# Patient Record
Sex: Female | Born: 1995 | Race: Black or African American | Hispanic: No | Marital: Married | Smoking: Never smoker
Health system: Southern US, Community
[De-identification: ages and names within clinical notes are randomized; demographics above are authoritative.]

## PROBLEM LIST (undated history)

## (undated) ENCOUNTER — Inpatient Hospital Stay (HOSPITAL_COMMUNITY): Payer: Self-pay

## (undated) DIAGNOSIS — E559 Vitamin D deficiency, unspecified: Secondary | ICD-10-CM

## (undated) DIAGNOSIS — R002 Palpitations: Secondary | ICD-10-CM

## (undated) DIAGNOSIS — G43909 Migraine, unspecified, not intractable, without status migrainosus: Secondary | ICD-10-CM

## (undated) DIAGNOSIS — K5900 Constipation, unspecified: Secondary | ICD-10-CM

## (undated) DIAGNOSIS — F32A Depression, unspecified: Secondary | ICD-10-CM

## (undated) DIAGNOSIS — F419 Anxiety disorder, unspecified: Secondary | ICD-10-CM

## (undated) DIAGNOSIS — M549 Dorsalgia, unspecified: Secondary | ICD-10-CM

## (undated) HISTORY — DX: Dorsalgia, unspecified: M54.9

## (undated) HISTORY — DX: Depression, unspecified: F32.A

## (undated) HISTORY — DX: Migraine, unspecified, not intractable, without status migrainosus: G43.909

## (undated) HISTORY — DX: Constipation, unspecified: K59.00

## (undated) HISTORY — DX: Vitamin D deficiency, unspecified: E55.9

## (undated) HISTORY — DX: Anxiety disorder, unspecified: F41.9

## (undated) HISTORY — DX: Palpitations: R00.2

---
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
## 2014-01-23 ENCOUNTER — Emergency Department (HOSPITAL_COMMUNITY): Payer: BC Managed Care – PPO

## 2014-01-23 ENCOUNTER — Emergency Department (HOSPITAL_COMMUNITY)
Admission: EM | Admit: 2014-01-23 | Discharge: 2014-01-23 | Disposition: A | Discharge status: Home / Self Care | Payer: BC Managed Care – PPO | Attending: Emergency Medicine | Admitting: Emergency Medicine

## 2014-01-23 ENCOUNTER — Encounter (HOSPITAL_COMMUNITY): Payer: Self-pay | Admitting: Emergency Medicine

## 2014-01-23 DIAGNOSIS — Z880 Allergy status to penicillin: Secondary | ICD-10-CM | POA: Diagnosis not present

## 2014-01-23 DIAGNOSIS — R079 Chest pain, unspecified: Secondary | ICD-10-CM | POA: Diagnosis not present

## 2014-01-23 DIAGNOSIS — R0602 Shortness of breath: Secondary | ICD-10-CM | POA: Diagnosis not present

## 2014-01-23 DIAGNOSIS — M79604 Pain in right leg: Secondary | ICD-10-CM | POA: Insufficient documentation

## 2014-01-23 DIAGNOSIS — M79609 Pain in unspecified limb: Secondary | ICD-10-CM

## 2014-01-23 DIAGNOSIS — Z3202 Encounter for pregnancy test, result negative: Secondary | ICD-10-CM | POA: Diagnosis not present

## 2014-01-23 DIAGNOSIS — M25512 Pain in left shoulder: Secondary | ICD-10-CM | POA: Diagnosis not present

## 2014-01-23 DIAGNOSIS — R05 Cough: Secondary | ICD-10-CM | POA: Insufficient documentation

## 2014-01-23 LAB — CBC WITH DIFFERENTIAL/PLATELET
Basophils Absolute: 0 K/uL (ref 0.0–0.1)
Basophils Relative: 0 % (ref 0–1)
Eosinophils Absolute: 0 K/uL (ref 0.0–0.7)
Eosinophils Relative: 1 % (ref 0–5)
HCT: 37.4 % (ref 36.0–46.0)
Hemoglobin: 12.7 g/dL (ref 12.0–15.0)
Lymphocytes Relative: 39 % (ref 12–46)
Lymphs Abs: 2.2 K/uL (ref 0.7–4.0)
MCH: 28.9 pg (ref 26.0–34.0)
MCHC: 34 g/dL (ref 30.0–36.0)
MCV: 85 fL (ref 78.0–100.0)
Monocytes Absolute: 0.4 K/uL (ref 0.1–1.0)
Monocytes Relative: 6 % (ref 3–12)
Neutro Abs: 3.1 K/uL (ref 1.7–7.7)
Neutrophils Relative %: 54 % (ref 43–77)
Platelets: 204 K/uL (ref 150–400)
RBC: 4.4 MIL/uL (ref 3.87–5.11)
RDW: 11.4 % — ABNORMAL LOW (ref 11.5–15.5)
WBC: 5.8 K/uL (ref 4.0–10.5)

## 2014-01-23 LAB — BASIC METABOLIC PANEL
Anion gap: 11 (ref 5–15)
BUN: 11 mg/dL (ref 6–23)
CALCIUM: 9.5 mg/dL (ref 8.4–10.5)
CO2: 24 mEq/L (ref 19–32)
Chloride: 100 mEq/L (ref 96–112)
Creatinine, Ser: 0.65 mg/dL (ref 0.50–1.10)
GFR calc Af Amer: >90 mL/min (ref >90)
GLUCOSE: 88 mg/dL (ref 70–99)
Potassium: 3.9 mEq/L (ref 3.7–5.3)
Sodium: 135 mEq/L — ABNORMAL LOW (ref 137–147)

## 2014-01-23 LAB — POC URINE PREG, ED: Preg Test, Ur: NEGATIVE

## 2014-01-23 LAB — D-DIMER, QUANTITATIVE: D-Dimer, Quant: <0.27 ug{FEU}/mL (ref 0.00–0.48)

## 2014-01-23 LAB — I-STAT TROPONIN, ED: Troponin i, poc: 0 ng/mL (ref 0.00–0.08)

## 2014-01-23 MED ORDER — HYDROCODONE-ACETAMINOPHEN 5-325 MG PO TABS
2.0000 | ORAL_TABLET | Freq: Once | ORAL | Status: AC
  Administered 2014-01-23: 2 via ORAL
  Filled 2014-01-23: qty 2

## 2014-01-23 MED ORDER — NAPROXEN 500 MG PO TABS: 500.0000 mg | ORAL_TABLET | Freq: Two times a day (BID) | ORAL | Status: DC

## 2014-01-23 NOTE — Discharge Instructions (Signed)

## 2014-01-23 NOTE — ED Notes (Addendum)
Pt sts she stopped taking her birth control approx 1 week ago.  Sts pain in right knee started last night.  Pt sts she was walking across campus and began to feel pain and has not resolved this morning.  Pt sts pain is worse with movement.   Pt sts chest pain started when she was "looking around on the Internet for a diagnosis".   Pt sts she has a hx of anxiety, but does not currently take anything for it.

## 2014-01-23 NOTE — ED Notes (Signed)
Pt. Stated, I started having leg pain on my right leg yesterday and chest pain started today. I also have a cold with a cough.

## 2014-01-23 NOTE — ED Notes (Signed)
Spoke to xray, are coming to get pt now.

## 2014-01-23 NOTE — ED Provider Notes (Signed)
CSN: [id number]     Arrival date & time 01/23/14  1324 History   First MD Initiated Contact with Patient 01/23/14 1441     Chief Complaint  Patient presents with  . Leg Pain     (Consider location/radiation/quality/duration/timing/severity/associated sxs/prior Treatment) Patient is a 18 y.o. female presenting with leg pain and chest pain. The history is provided by the patient. No language interpreter was used.  Leg Pain Location:  Leg Time since incident:  1 day Injury: no   Leg location:  R lower leg Pain details:    Quality:  Aching   Radiates to:  Does not radiate   Severity:  Moderate   Duration:  1 day   Timing:  Constant   Progression:  Unchanged Chronicity:  New Dislocation: no   Foreign body present:  No foreign bodies Tetanus status:  Up to date Prior injury to area:  No Relieved by:  Nothing Worsened by:  Activity Ineffective treatments:  None tried Associated symptoms: swelling   Associated symptoms: no back pain, no decreased ROM, no fatigue, no fever, no itching, no muscle weakness, no neck pain, no numbness, no stiffness and no tingling   Chest Pain Pain location:  Substernal area Pain quality: pressure   Pain radiates to:  Does not radiate Pain radiates to the back: no   Pain severity:  Moderate Duration:  7 hours Timing:  Intermittent Progression:  Waxing and waning Chronicity:  New Context: at rest   Relieved by:  Nothing Worsened by:  Nothing tried Ineffective treatments:  Rest Associated symptoms: cough and shortness of breath   Associated symptoms: no abdominal pain, no anorexia, no back pain, no diaphoresis, no fatigue, no fever, no headache, no nausea, no numbness, no palpitations, no syncope, not vomiting and no weakness   Associated symptoms comment:  Chills, body aches Risk factors: birth control   Risk factors: no aortic disease, no coronary artery disease, no diabetes mellitus, no high cholesterol, no hypertension, no immobilization, not  female, no Marfan's syndrome, not obese, no prior DVT/PE, no smoking and no surgery     History reviewed. No pertinent past medical history. History reviewed. No pertinent past surgical history. No family history on file. History  Substance Use Topics  . Smoking status: Never Smoker   . Smokeless tobacco: Not on file  . Alcohol Use: No   OB History   Grav Para Term Preterm Abortions TAB SAB Ect Mult Living                 Review of Systems  Constitutional: Negative for fever, chills, diaphoresis, activity change, appetite change and fatigue.  HENT: Negative for congestion, facial swelling, rhinorrhea and sore throat.   Eyes: Negative for photophobia and discharge.  Respiratory: Positive for cough and shortness of breath. Negative for chest tightness.   Cardiovascular: Positive for chest pain. Negative for palpitations, leg swelling and syncope.  Gastrointestinal: Negative for nausea, vomiting, abdominal pain, diarrhea and anorexia.  Endocrine: Negative for polydipsia and polyuria.  Genitourinary: Negative for dysuria, frequency, difficulty urinating and pelvic pain.  Musculoskeletal: Negative for arthralgias, back pain, neck pain, neck stiffness and stiffness.  Skin: Negative for color change, itching and wound.  Allergic/Immunologic: Negative for immunocompromised state.  Neurological: Negative for facial asymmetry, weakness, numbness and headaches.  Hematological: Does not bruise/bleed easily.  Psychiatric/Behavioral: Negative for confusion and agitation.      Allergies  Penicillins  Home Medications   Prior to Admission medications   Medication Sig Start  Date End Date Taking? Authorizing Provider  ibuprofen (ADVIL,MOTRIN) 200 MG tablet Take 200 mg by mouth every 6 (six) hours as needed.   Yes Historical Provider, MD  naproxen (NAPROSYN) 500 MG tablet Take 1 tablet (500 mg total) by mouth 2 (two) times daily. 01/23/14   Toy Cookey, MD   BP 128/77  Pulse 71   Temp(Src) 98.1 F (36.7 C) (Oral)  Resp 18  SpO2 99%  LMP 01/14/2014 Physical Exam  Constitutional: She is oriented to person, place, and time. She appears well-developed and well-nourished. No distress.  HENT:  Head: Normocephalic and atraumatic.  Mouth/Throat: No oropharyngeal exudate.  Eyes: Pupils are equal, round, and reactive to light.  Neck: Normal range of motion. Neck supple.    Cardiovascular: Normal rate, regular rhythm and normal heart sounds.  Exam reveals no gallop and no friction rub.   No murmur heard. Pulmonary/Chest: Effort normal and breath sounds normal. No respiratory distress. She has no wheezes. She has no rales.    Abdominal: Soft. Bowel sounds are normal. She exhibits no distension and no mass. There is no tenderness. There is no rebound and no guarding.  Musculoskeletal: Normal range of motion. She exhibits no edema.       Left shoulder: She exhibits tenderness.       Back:       Arms:      Legs: Neurological: She is alert and oriented to person, place, and time.  Skin: Skin is warm and dry.  Psychiatric: She has a normal mood and affect.    ED Course  Procedures (including critical care time) Labs Review Labs Reviewed  CBC WITH DIFFERENTIAL - Abnormal; Notable for the following:    RDW 11.4 (*)    All other components within normal limits  BASIC METABOLIC PANEL - Abnormal; Notable for the following:    Sodium 135 (*)    All other components within normal limits  D-DIMER, QUANTITATIVE  POC URINE PREG, ED  Rosezena Sensor, ED    Imaging Review Dg Chest 2 View  01/23/2014   CLINICAL DATA:  Chest pain.  Shortness of breath.  EXAM: CHEST  2 VIEW  COMPARISON:  None.  FINDINGS: The heart size and mediastinal contours are within normal limits. Both lungs are clear. The visualized skeletal structures are unremarkable.  IMPRESSION: No active cardiopulmonary disease.   Electronically Signed   By: Myles Rosenthal M.D.   On: 01/23/2014 19:39     EKG  Interpretation   Date/Time:  Saturday January 23 2014 16:34:14 EDT Ventricular Rate:  70 PR Interval:  190 QRS Duration: 84 QT Interval:  389 QTC Calculation: 420 R Axis:   101 Text Interpretation:  Sinus rhythm Borderline right axis deviation No  prior for comparison Confirmed by DOCHERTY  MD, MEGAN ([id number]) on 01/23/2014  4:39:00 PM      MDM   Final diagnoses:  Chest pain  Right leg pain    Pt is a 18 y.o. female with Pmhx as above who presents with about 24 hours of right posterior leg pain without known injury as well as 7 hours of intermittent midsternal chest pressure without radiation. She has had mild associated shortness of breath, no fevers. She also reports about 1 week of URI symptoms with productive cough, chills and body aches. She has recently been on OCPs, though has not taken them for one week. She is a nonsmoker. No significant recent travel history or immobilization. On physical exam vital signs are stable and she  is in no acute distress. Cardiopulmonary exam is benign. She has tenderness to palpation of right calf and right posterior knee.  DVT study nml, CXR nml, trop, D-dimer nml. Doubt PE or cardiogenic CP, Will d/c home w/ BID naproxen. Return precautions given for new or worsening symptoms including worsening pain, fever, inability to tolerate PO.         Toy CookeyMegan Docherty, MD 01/23/14 2001

## 2015-11-24 IMAGING — CR DG CHEST 2V
2 series · 2 of 2 positions shown · non-contrast
Comparison: None.

CLINICAL DATA: Chest pain.  Shortness of breath.

EXAM:
CHEST  2 VIEW

[w chest pa]
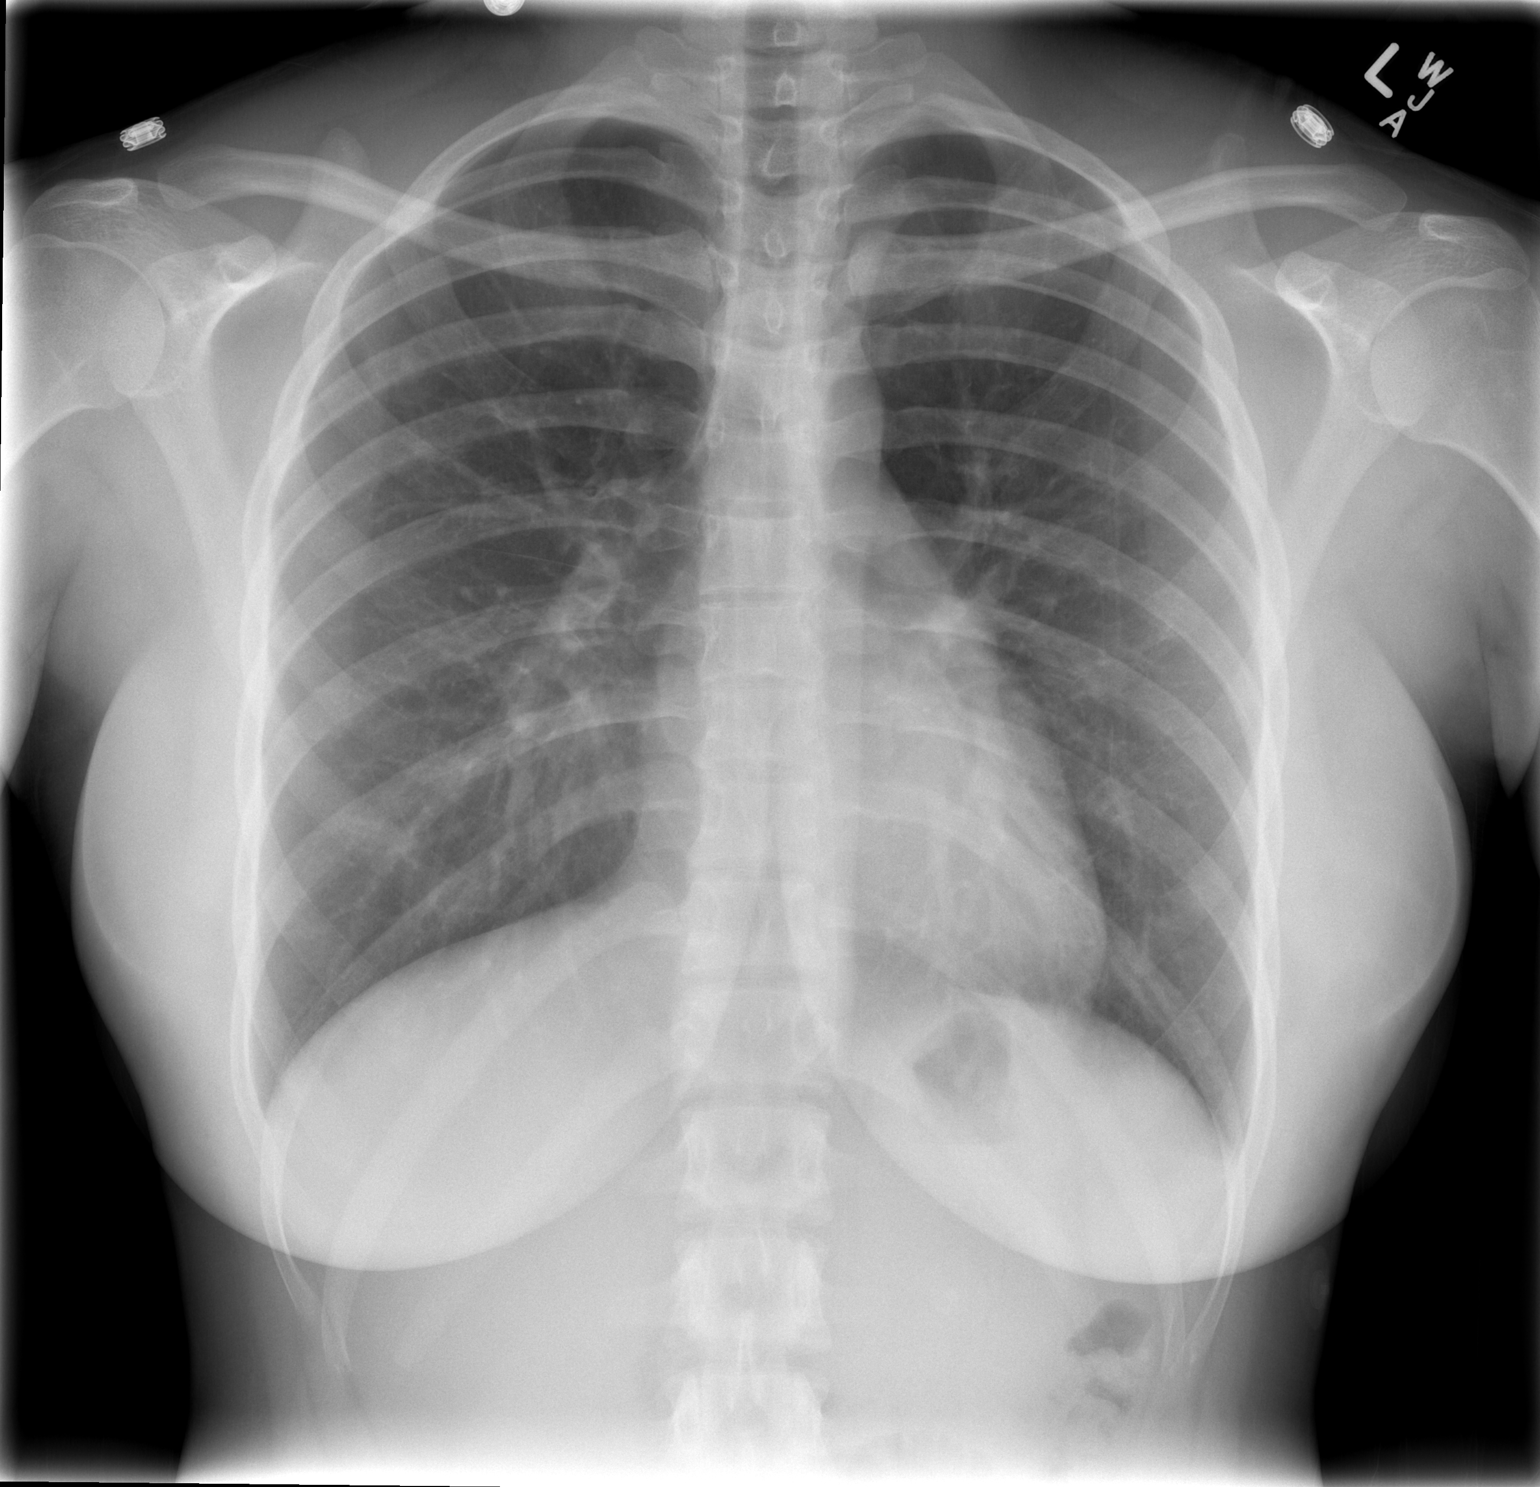

[w chest lat]
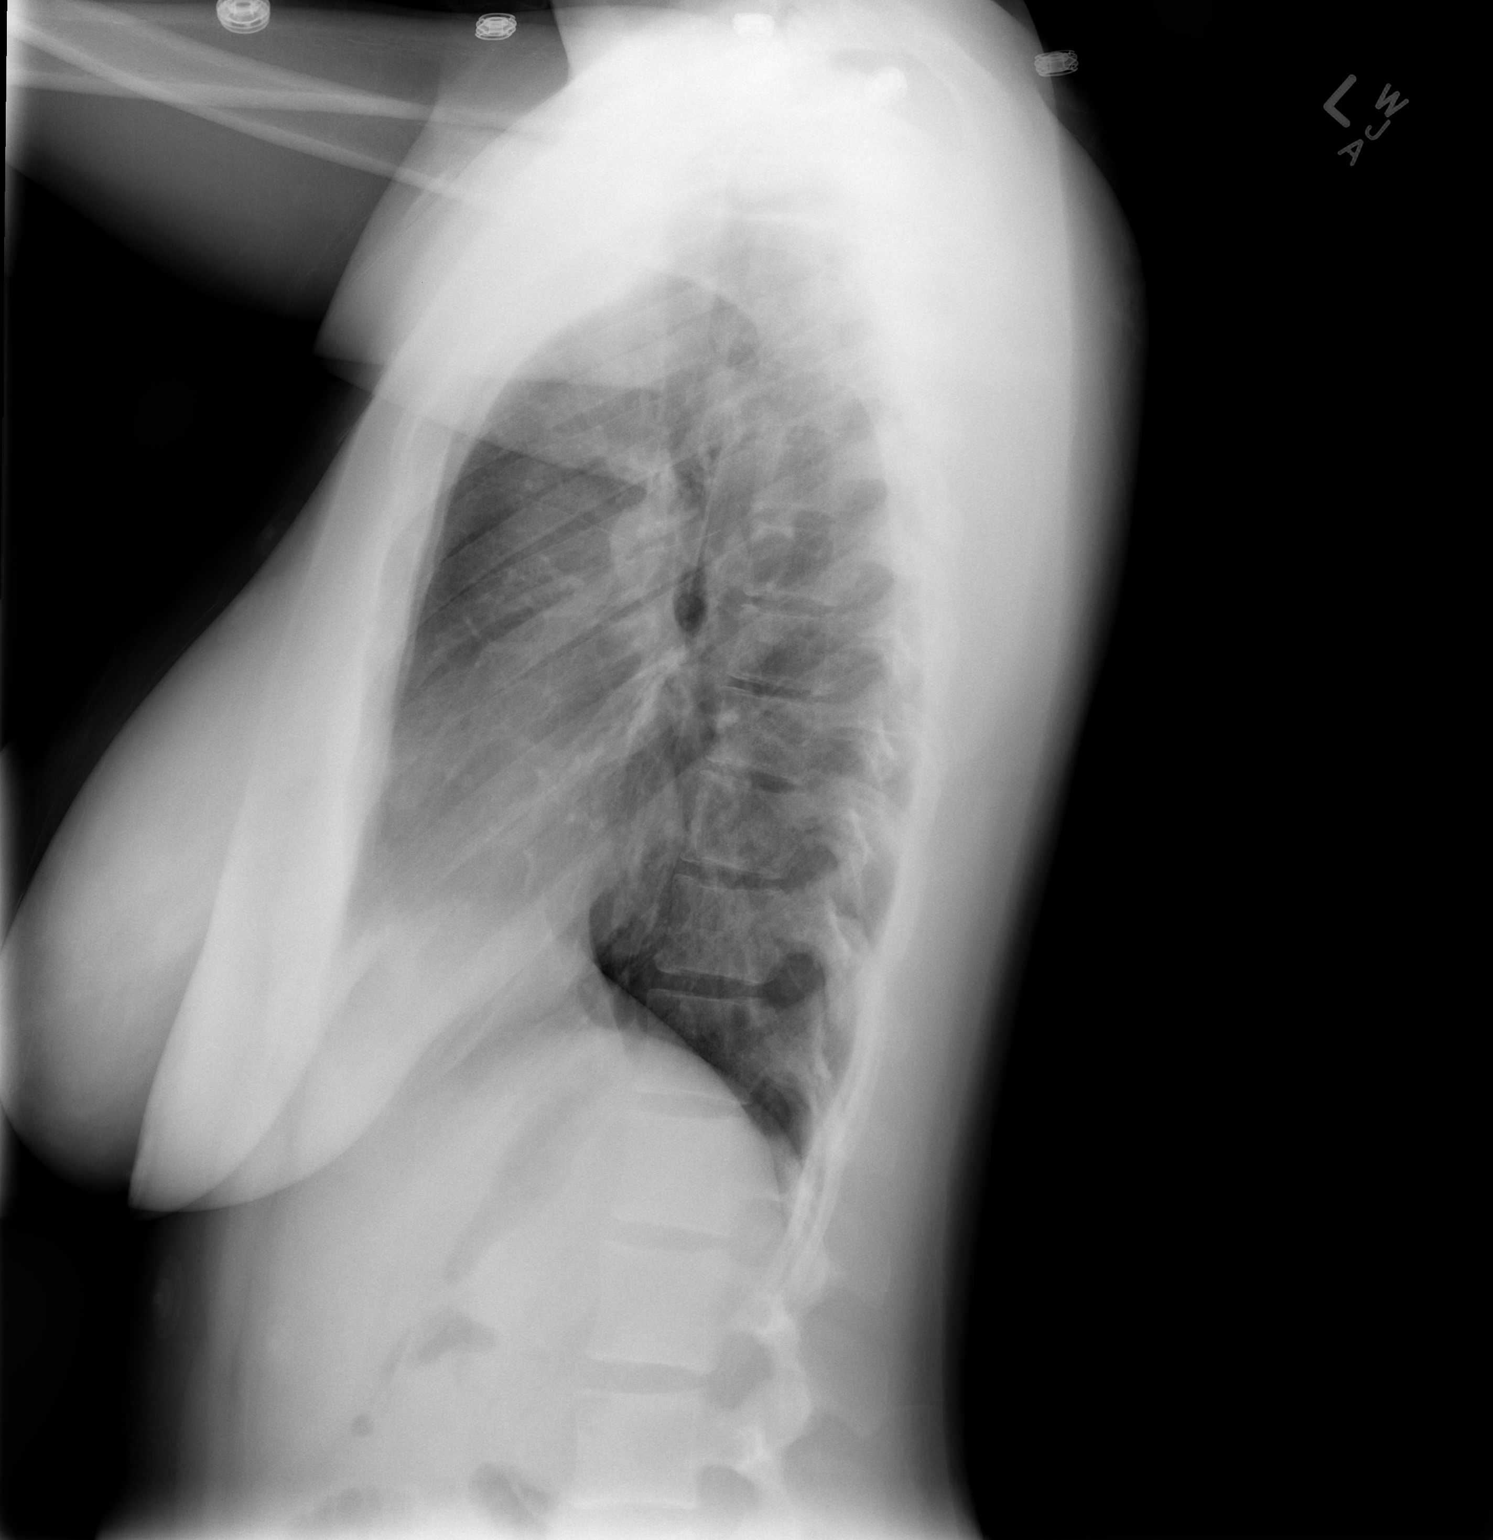

[2 of 2 positions shown; findings below may reference images not displayed]

FINDINGS: The heart size and mediastinal contours are within normal limits.
Both lungs are clear. The visualized skeletal structures are
unremarkable.
IMPRESSION: No active cardiopulmonary disease.

## 2017-02-16 ENCOUNTER — Encounter (HOSPITAL_COMMUNITY): Payer: Self-pay | Admitting: *Deleted

## 2017-02-16 ENCOUNTER — Other Ambulatory Visit: Payer: Self-pay

## 2017-02-16 ENCOUNTER — Ambulatory Visit (HOSPITAL_COMMUNITY)
Admission: EM | Admit: 2017-02-16 | Discharge: 2017-02-16 | Disposition: A | Discharge status: Home / Self Care | Payer: BLUE CROSS/BLUE SHIELD | Attending: Internal Medicine | Admitting: Internal Medicine

## 2017-02-16 DIAGNOSIS — M5489 Other dorsalgia: Secondary | ICD-10-CM | POA: Diagnosis not present

## 2017-02-16 DIAGNOSIS — R0789 Other chest pain: Secondary | ICD-10-CM

## 2017-02-16 DIAGNOSIS — T148XXA Other injury of unspecified body region, initial encounter: Secondary | ICD-10-CM

## 2017-02-16 MED ORDER — NAPROXEN 500 MG PO TABS: 500.0000 mg | ORAL_TABLET | Freq: Two times a day (BID) | ORAL | 0 refills | Status: AC

## 2017-02-16 NOTE — ED Provider Notes (Signed)
[facility]    CSN: [id number] Arrival date & time: 02/16/17  1424     History   Chief Complaint Chief Complaint  Patient presents with  . Back Pain  . Chest Pain    HPI Donna Galloway is a 21 y.o. female.   21 year old female comes in for left scapula pain and chest pain last night. States chest pain has resolved but having left scapula pain during movements. States no pain at rest. 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 She states turning her neck can increase symptoms as well. Has not taken anything for the symptoms. Denies shortness of breath, wheezing. States some tingling of the hands occasionally. States family history significant for heart disease at late 3730's.       History reviewed. No pertinent past medical history.  There are no active problems to display for this patient.   History reviewed. No pertinent surgical history.  OB History    No data available       Home Medications    Prior to Admission medications   Medication Sig Start Date End Date Taking? Authorizing Provider  naproxen (NAPROSYN) 500 MG tablet Take 1 tablet (500 mg total) by mouth 2 (two) times daily for 10 days. 02/16/17 02/26/17  Belinda FisherYu, Amy V, PA-C    Family History No family history on file.  Social History Social History   Tobacco Use  . Smoking status: Current Some Day Smoker  . Tobacco comment: black & milds  Substance Use Topics  . Alcohol use: Yes    Comment: occasionally  . Drug use: No     Allergies   Acetic acid and Penicillins   Review of Systems Review of Systems  Reason unable to perform ROS: See HPI as above.     Physical Exam Triage Vital Signs ED Triage Vitals [02/16/17 1524]  Enc Vitals Group     BP 112/65     Pulse Rate 82     Resp 18     Temp 98.3 F (36.8 C)     Temp Source Oral     SpO2 100 %     Weight      Height      Head Circumference      Peak Flow      Pain Score      Pain Loc    Pain Edu?      Excl. in GC?    No data found.  Updated Vital Signs BP 112/65   Pulse 82   Temp 98.3 F (36.8 C) (Oral)   Resp 18   LMP 01/26/2017 (Approximate)   SpO2 100%   Physical Exam  Constitutional: She is oriented to person, place, and time. She appears well-developed and well-nourished. No distress.  HENT:  Head: Normocephalic and atraumatic.  Eyes: Conjunctivae are normal. Pupils are equal, round, and reactive to light.  Neck: Neck supple. Muscular tenderness (left) present. No spinous process tenderness present. Normal range of motion present.  Cardiovascular: Normal rate, regular rhythm and normal heart sounds. Exam reveals no gallop and no friction rub.  No murmur heard. Pulmonary/Chest: Effort normal and breath sounds normal. She has no wheezes. She has no rales. She exhibits no tenderness.  Musculoskeletal:  No tenderness on palpation of the spinous processes. Diffuse tenderness to palpation of the left upper back/trapezius muscle. Full range of motion of shoulders. Strength normal and equal bilaterally. Sensation intact and equal bilaterally.  Radial pulses  2+ and equal bilaterally. Capillary refill less than 2 seconds.   Neurological: She is alert and oriented to person, place, and time.  Skin: Skin is warm and dry.     UC Treatments / Results  Labs (all labs ordered are listed, but only abnormal results are displayed) Labs Reviewed - No data to display  EKG  EKG Interpretation None       Radiology No results found.  Procedures Procedures (including critical care time)  Medications Ordered in UC Medications - No data to display   Initial Impression / Assessment and Plan / UC Course  I have reviewed the triage vital signs and the nursing notes.  Pertinent labs & imaging results that were available during my care of the patient were reviewed by me and considered in my medical decision making (see chart for details).    Discussed with patient  history and exam most consistent with muscle strain. Start NSAID as directed for pain and inflammation. Ice/heat compresses. Discussed with patient strain can take up to 3-4 weeks to resolve, but should be getting better each week. Patient can follow up with PCP for stress test as needed if worried about cardiac problems. As patient without chest pain currently, deferred testing. Return precautions given.    Final Clinical Impressions(s) / UC Diagnoses   Final diagnoses:  Muscle strain    ED Discharge Orders        Ordered    naproxen (NAPROSYN) 500 MG tablet  2 times daily     02/16/17 1626        Belinda FisherYu, Amy V, [place]-C 02/16/17 1640

## 2017-02-16 NOTE — Discharge Instructions (Signed)
Your exam was most consistent with muscle strain. Start naproxen as directed. Ice/heat compresses as needed. This can take up to 3-4 weeks to completely resolve, but you should be feeling better each week. Stretches before work may help warm up your muscles to prevent further injury. Follow up here or with PCP if symptoms worsen, changes for reevaluation.

## 2017-02-16 NOTE — ED Triage Notes (Signed)
Started with pain beneath left scapula and left chest last night; pain has been constant with any movement or palpation.  At rest no pain.

## 2017-03-07 ENCOUNTER — Encounter (HOSPITAL_COMMUNITY): Payer: Self-pay | Admitting: Family Medicine

## 2017-03-07 ENCOUNTER — Ambulatory Visit (HOSPITAL_COMMUNITY)
Admission: EM | Admit: 2017-03-07 | Discharge: 2017-03-07 | Disposition: A | Discharge status: Home / Self Care | Payer: BLUE CROSS/BLUE SHIELD | Attending: Internal Medicine | Admitting: Internal Medicine

## 2017-03-07 DIAGNOSIS — R110 Nausea: Secondary | ICD-10-CM | POA: Diagnosis not present

## 2017-03-07 MED ORDER — ONDANSETRON HCL 4 MG PO TABS: 4.0000 mg | ORAL_TABLET | Freq: Four times a day (QID) | ORAL | 0 refills | Status: AC

## 2017-03-07 MED ORDER — ONDANSETRON 4 MG PO TBDP
4.0000 mg | ORAL_TABLET | Freq: Once | ORAL | Status: AC
  Administered 2017-03-07: 4 mg via ORAL

## 2017-03-07 MED ORDER — ONDANSETRON 4 MG PO TBDP
ORAL_TABLET | ORAL | Status: AC
  Filled 2017-03-07: qty 1

## 2017-03-07 NOTE — Discharge Instructions (Signed)
For nausea: Zofran prescribed. Begin with every 6 hours, than as you are able to hold food down, take it as needed. Start with clear liquids, then move to plain foods like bananas, rice, applesauce, toast, broth, grits, oatmeal. As those food settle okay you may transition to your normal foods. Avoid spicy and greasy foods as much as possible.  Preventing dehydration is key! You need to replace the fluid your body is expelling. Drink plenty of fluids, may use Pedialyte or sports drinks.   For pain: may Midol over the counter- if unable to keep things down. As your nausea is controlled and able to hold food down you may try ibuprofen for pain. Use heating pad as needed on abdomen for pain.   Please return if you are experiencing blood in your vomit or stool or experiencing dizziness, lightheadedness, extreme fatigue, increased abdominal pain.   Follow up with OBGYN offices below to establish care, discuss birth control options, pap smear, and discuss need for ultrasound.

## 2017-03-07 NOTE — ED Triage Notes (Signed)
Pt here for nausea that started today related to her menstrual cycle. Unable to take anything for the pain.

## 2017-03-07 NOTE — ED Provider Notes (Signed)
[facility]    CSN: [id number] Arrival date & time: 03/07/17  1631     History   Chief Complaint Chief Complaint  Patient presents with  . Nausea  . Menstrual Problem    HPI Donna Galloway is a 21 y.o. female presenting with nausea and vomiting with onset of menstrual cycle. States she has been nauseous and vomiting for the past 2-3 days. Period started today. Her periods are normally heavy and she experiences significant cramping and nausea but able to hold food down and take ibuprofen for pain. This period has been worse where she cant hold food down and subsequently can't take ibuprofen. Not on birth control. Mom has history of fibroids.  HPI  History reviewed. No pertinent past medical history.  There are no active problems to display for this patient.   History reviewed. No pertinent surgical history.  OB History    No data available       Home Medications    Prior to Admission medications   Medication Sig Start Date End Date Taking? Authorizing Provider  ondansetron (ZOFRAN) 4 MG tablet Take 1 tablet (4 mg total) by mouth every 6 (six) hours for 7 days. 03/07/17 03/14/17  Wieters, Junius CreamerHallie C, PA-C    Family History History reviewed. No pertinent family history.  Social History Social History   Tobacco Use  . Smoking status: Current Some Day Smoker  . Tobacco comment: black & milds  Substance Use Topics  . Alcohol use: Yes    Comment: occasionally  . Drug use: No     Allergies   Acetic acid and Penicillins   Review of Systems Review of Systems  Constitutional: Positive for appetite change. Negative for chills, fatigue and fever.  HENT: Negative for congestion and sore throat.   Respiratory: Negative for cough and shortness of breath.   Cardiovascular: Negative for chest pain.  Gastrointestinal: Positive for abdominal pain, diarrhea, nausea and vomiting. Negative for blood in stool.  Genitourinary: Positive for menstrual problem and  vaginal bleeding. Negative for dyspareunia and vaginal discharge.  Musculoskeletal: Negative for back pain, myalgias and neck pain.  Skin: Negative for rash.  Neurological: Positive for headaches. Negative for dizziness and light-headedness.     Physical Exam Triage Vital Signs ED Triage Vitals  Enc Vitals Group     BP 03/07/17 1643 123/71     Pulse Rate 03/07/17 1643 74     Resp 03/07/17 1643 18     Temp 03/07/17 1643 98.2 F (36.8 C)     Temp src --      SpO2 03/07/17 1643 100 %     Weight --      Height --      Head Circumference --      Peak Flow --      Pain Score 03/07/17 1640 9     Pain Loc --      Pain Edu? --      Excl. in GC? --    No data found.  Updated Vital Signs BP 123/71   Pulse 74   Temp 98.2 F (36.8 C)   Resp 18   LMP 03/07/2017   SpO2 100%    Physical Exam  Constitutional: She is oriented to person, place, and time. She appears well-developed and well-nourished. No distress.  HENT:  Head: Normocephalic and atraumatic.  Mouth/Throat: Oropharynx is clear and moist.  Eyes: Conjunctivae are normal.  Neck: Normal range of motion. Neck supple.  Cardiovascular: Normal rate and  regular rhythm.  No murmur heard. Pulmonary/Chest: Effort normal and breath sounds normal. No respiratory distress.  Abdominal: Soft. She exhibits no distension. There is tenderness. There is no guarding.  Mild tenderness to lower quadrants  Musculoskeletal: She exhibits no edema.  Neurological: She is alert and oriented to person, place, and time.  Skin: Skin is warm and dry.  Psychiatric: She has a normal mood and affect.  Nursing note and vitals reviewed.    UC Treatments / Results  Labs (all labs ordered are listed, but only abnormal results are displayed) Labs Reviewed - No data to display  EKG  EKG Interpretation None       Radiology No results found.  Procedures Procedures (including critical care time)  Medications Ordered in UC Medications    ondansetron (ZOFRAN-ODT) disintegrating tablet 4 mg (4 mg Oral Given 03/07/17 1643)     Initial Impression / Assessment and Plan / UC Course  I have reviewed the triage vital signs and the nursing notes.  Pertinent labs & imaging results that were available during my care of the patient were reviewed by me and considered in my medical decision making (see chart for details).     Zofran given in clinic with oral challenge. Patient drank water and ate crackers, held down for about 30 minutes. Still feeling nauseous but not having vomiting sensation. Sent with zofran prescription. Advised to try midol until she can hold food down and then transition to ibuprofen. [facility] given to establish care/ discuss BC options/pap smear.  Final Clinical Impressions(s) / UC Diagnoses   Final diagnoses:  Nausea    ED Discharge Orders        Ordered    ondansetron (ZOFRAN) 4 MG tablet  Every 6 hours     03/07/17 1713       Controlled Substance Prescriptions [place] Controlled Substance Registry consulted? Not Applicable   Lew DawesWieters, Hallie C, [place]PA-C 03/07/17 2047

## 2018-03-16 ENCOUNTER — Emergency Department (HOSPITAL_COMMUNITY)
Admission: EM | Admit: 2018-03-16 | Discharge: 2018-03-16 | Disposition: A | Discharge status: Home / Self Care | Payer: BLUE CROSS/BLUE SHIELD | Attending: Emergency Medicine | Admitting: Emergency Medicine

## 2018-03-16 ENCOUNTER — Encounter (HOSPITAL_COMMUNITY): Payer: Self-pay

## 2018-03-16 DIAGNOSIS — R509 Fever, unspecified: Secondary | ICD-10-CM | POA: Insufficient documentation

## 2018-03-16 DIAGNOSIS — M7918 Myalgia, other site: Secondary | ICD-10-CM | POA: Diagnosis present

## 2018-03-16 DIAGNOSIS — F17210 Nicotine dependence, cigarettes, uncomplicated: Secondary | ICD-10-CM | POA: Diagnosis not present

## 2018-03-16 DIAGNOSIS — J101 Influenza due to other identified influenza virus with other respiratory manifestations: Secondary | ICD-10-CM | POA: Diagnosis not present

## 2018-03-16 DIAGNOSIS — R05 Cough: Secondary | ICD-10-CM | POA: Insufficient documentation

## 2018-03-16 DIAGNOSIS — R110 Nausea: Secondary | ICD-10-CM | POA: Insufficient documentation

## 2018-03-16 LAB — URINALYSIS, MICROSCOPIC (REFLEX)

## 2018-03-16 LAB — URINALYSIS, ROUTINE W REFLEX MICROSCOPIC
Bilirubin Urine: NEGATIVE
Glucose, UA: NEGATIVE mg/dL
Ketones, ur: 15 mg/dL — AB
NITRITE: POSITIVE — AB
PH: 6.5 (ref 5.0–8.0)
Protein, ur: 100 mg/dL — AB
Specific Gravity, Urine: 1.02 (ref 1.005–1.030)

## 2018-03-16 LAB — INFLUENZA PANEL BY PCR (TYPE A & B)
INFLAPCR: NEGATIVE
Influenza B By PCR: POSITIVE — AB

## 2018-03-16 LAB — POC URINE PREG, ED: Preg Test, Ur: NEGATIVE

## 2018-03-16 MED ORDER — OSELTAMIVIR PHOSPHATE 75 MG PO CAPS
75.0000 mg | ORAL_CAPSULE | Freq: Once | ORAL | Status: AC
  Administered 2018-03-16: 75 mg via ORAL
  Filled 2018-03-16: qty 1

## 2018-03-16 MED ORDER — ACETAMINOPHEN 500 MG PO TABS
1000.0000 mg | ORAL_TABLET | Freq: Once | ORAL | Status: AC
  Administered 2018-03-16: 1000 mg via ORAL
  Filled 2018-03-16: qty 2

## 2018-03-16 MED ORDER — OSELTAMIVIR PHOSPHATE 75 MG PO CAPS: 75.0000 mg | ORAL_CAPSULE | Freq: Two times a day (BID) | ORAL | 0 refills | Status: DC

## 2018-03-16 MED ORDER — BENZONATATE 100 MG PO CAPS: 100.0000 mg | ORAL_CAPSULE | Freq: Three times a day (TID) | ORAL | 0 refills | Status: DC

## 2018-03-16 NOTE — ED Triage Notes (Signed)
Pt reports flu like symptoms for the past two days with headaches, nausea, generalized body aches and cough.

## 2018-03-16 NOTE — Discharge Instructions (Addendum)
Take tylenol and ibuprofen as needed for pain and fever. Follow up with your doctor or return here for worsening symptoms. Be sure you are drinking lots of fluids so you do not get dehydrated.

## 2018-03-16 NOTE — ED Provider Notes (Signed)
[facility] Provider Note   CSN: [id number] Arrival date & time: 03/16/18  1840     History   Chief Complaint Chief Complaint  Patient presents with  . Generalized Body Aches  . Cough  . Nausea    HPI Shayne AlkenFelicia Defrancesco is a 22 y.o. female who presents to the ED with flu like symptoms. The symptoms started this morning with sore throat, fever, chills and cough. 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  The history is provided by the patient. No language interpreter was used.  Influenza  Presenting symptoms: cough, fever, headache, myalgias and sore throat   Presenting symptoms: no nausea and no vomiting   Severity:  Moderate Onset quality:  Gradual Duration:  12 hours Progression:  Worsening Chronicity:  New Relieved by:  Nothing Worsened by:  Nothing Associated symptoms: chills, decreased appetite and nasal congestion   Associated symptoms: no ear pain     History reviewed. No pertinent past medical history.  There are no active problems to display for this patient.   History reviewed. No pertinent surgical history.   OB History   No obstetric history on file.      Home Medications    Prior to Admission medications   Medication Sig Start Date End Date Taking? Authorizing Provider  benzonatate (TESSALON) 100 MG capsule Take 1 capsule (100 mg total) by mouth every 8 (eight) hours. 03/16/18   Janne NapoleonNeese, Graylin Sperling M, NP  oseltamivir (TAMIFLU) 75 MG capsule Take 1 capsule (75 mg total) by mouth every 12 (twelve) hours. 03/16/18   Janne NapoleonNeese, Deegan Valentino M, NP    Family History No family history on file.  Social History Social History   Tobacco Use  . Smoking status: Current Some Day Smoker  . Tobacco comment: black & milds  Substance Use Topics  . Alcohol use: Yes    Comment: occasionally  . Drug use: No     Allergies   Acetic acid and Penicillins   Review of Systems Review of Systems  Constitutional:  Positive for chills, decreased appetite and fever.  HENT: Positive for congestion and sore throat. Negative for ear pain.   Eyes: Negative for pain, discharge and itching.  Respiratory: Positive for cough.   Cardiovascular: Negative for chest pain.  Gastrointestinal: Negative for abdominal pain, nausea and vomiting.  Genitourinary: Negative for dysuria, frequency and urgency.  Musculoskeletal: Positive for myalgias.  Skin: Negative for rash.  Neurological: Positive for headaches.  Hematological: Negative for adenopathy.  Psychiatric/Behavioral: Negative for confusion.     Physical Exam Updated Vital Signs BP 117/80 (BP Location: Right Arm)   Pulse 78   Temp 98.2 F (36.8 C) (Oral)   Resp 17   Ht 5\' 4"  (1.626 m)   Wt 63.5 kg   SpO2 100%   BMI 24.03 kg/m   Physical Exam Vitals signs and nursing note reviewed.  Constitutional:      General: She is not in acute distress.    Appearance: She is well-developed.  HENT:     Head: Normocephalic.     Right Ear: Tympanic membrane normal.     Left Ear: Tympanic membrane normal.     Nose: Congestion present.     Mouth/Throat:     Pharynx: Posterior oropharyngeal erythema present.  Eyes:     Extraocular Movements: Extraocular movements intact.     Conjunctiva/sclera: Conjunctivae normal.  Neck:     Musculoskeletal: Normal range of motion and neck  supple. No neck rigidity.  Cardiovascular:     Rate and Rhythm: Normal rate and regular rhythm.  Pulmonary:     Effort: Pulmonary effort is normal. No respiratory distress.     Breath sounds: No wheezing or rales.  Abdominal:     Palpations: Abdomen is soft.     Tenderness: There is no abdominal tenderness.  Musculoskeletal: Normal range of motion.  Skin:    General: Skin is warm and dry.  Neurological:     Mental Status: She is alert and oriented to person, place, and time.     Cranial Nerves: No cranial nerve deficit.  Psychiatric:        Mood and Affect: Mood normal.       ED Treatments / Results  Labs (all labs ordered are listed, but only abnormal results are displayed) Labs Reviewed  INFLUENZA PANEL BY PCR (TYPE A & B) - Abnormal; Notable for the following components:      Result Value   Influenza B By PCR POSITIVE (*)    All other components within normal limits  URINALYSIS, ROUTINE W REFLEX MICROSCOPIC - Abnormal; Notable for the following components:   Color, Urine RED (*)    APPearance TURBID (*)    Hgb urine dipstick LARGE (*)    Ketones, ur 15 (*)    Protein, ur 100 (*)    Nitrite POSITIVE (*)    Leukocytes, UA MODERATE (*)    All other components within normal limits  URINALYSIS, MICROSCOPIC (REFLEX) - Abnormal; Notable for the following components:   Bacteria, UA FEW (*)    All other components within normal limits  POC URINE PREG, ED   Radiology No results found.  Procedures Procedures (including critical care time)  Medications Ordered in ED Medications  acetaminophen (TYLENOL) tablet 1,000 mg (1,000 mg Oral Given 03/16/18 2010)  oseltamivir (TAMIFLU) capsule 75 mg (75 mg Oral Given 03/16/18 2155)     Initial Impression / Assessment and Plan / ED Course  I have reviewed the triage vital signs and the nursing notes. SUBJECTIVE:  Shayne AlkenFelicia Hauss is a 22 y.o. female who present complaining of flu-like symptoms: fevers, chills, myalgias, congestion, sore throat and cough for1 day. Denies dyspnea or wheezing.  OBJECTIVE: Appears moderately ill but not toxic; temperature as noted in vitals. Ears normal. Throat and pharynx normal.  Neck supple. No adenopathy in the neck. Sinuses non tender. The chest is clear.  ASSESSMENT: Influenza B  PLAN: Symptomatic therapy suggested: rest, increase fluids, gargle prn for sore throat and use mist of vaporizer prn. tamiflu started, tylenol and ibuprofen for fever and body aches.   Urine was nitrite pos but patient having her period and lot of blood in urine. Patient denies UTI  symptoms.   Final Clinical Impressions(s) / ED Diagnoses   Final diagnoses:  Influenza B    ED Discharge Orders         Ordered    oseltamivir (TAMIFLU) 75 MG capsule  Every 12 hours     03/16/18 2157    benzonatate (TESSALON) 100 MG capsule  Every 8 hours     03/16/18 2159           Kerrie Buffaloeese, Kennidy Lamke TiburonM, NP 03/16/18 2236    Gerhard MunchLockwood, Robert, MD 03/16/18 2328

## 2018-03-16 NOTE — ED Notes (Signed)
Patient verbalizes understanding of medications and discharge instructions. No further questions at this time. VSS and patient ambulatory at discharge.   

## 2019-11-03 ENCOUNTER — Other Ambulatory Visit: Payer: Self-pay

## 2019-11-03 ENCOUNTER — Ambulatory Visit: Payer: BLUE CROSS/BLUE SHIELD | Attending: Internal Medicine

## 2019-11-03 DIAGNOSIS — Z20822 Contact with and (suspected) exposure to covid-19: Secondary | ICD-10-CM

## 2019-11-06 LAB — NOVEL CORONAVIRUS, NAA: SARS-CoV-2, NAA: NOT DETECTED

## 2019-11-10 ENCOUNTER — Ambulatory Visit: Payer: Medicaid Other | Attending: Family

## 2019-11-10 DIAGNOSIS — Z23 Encounter for immunization: Secondary | ICD-10-CM

## 2019-11-13 NOTE — Progress Notes (Signed)
   Covid-19 Vaccination Clinic  Name:  Donna Galloway    MRN: [id number] DOB: November 09, 1995  11/13/2019  Donna Galloway was observed post Covid-19 immunization for 15 minutes without incident. She was provided with Vaccine Information Sheet and instruction to access the V-Safe system.   Donna Galloway was instructed to call 911 with any severe reactions post vaccine: Marland Kitchen Difficulty breathing  . Swelling of face and throat  . A fast heartbeat  . A bad rash all over body  . Dizziness and weakness   Immunizations Administered    Name Date Dose VIS Date Route   Pfizer COVID-19 Vaccine 11/10/2019  3:30 PM 0.3 mL 05/20/2018 Intramuscular   Manufacturer: [employer], [employer]   Lot: [id number]   NDC: [address]-7

## 2019-11-24 ENCOUNTER — Ambulatory Visit: Payer: Self-pay

## 2019-12-01 ENCOUNTER — Ambulatory Visit: Payer: Medicaid Other | Attending: Internal Medicine

## 2019-12-01 DIAGNOSIS — Z23 Encounter for immunization: Secondary | ICD-10-CM

## 2019-12-01 NOTE — Progress Notes (Signed)
   Covid-19 Vaccination Clinic  Name:  Donna Galloway    MRN: [id number] DOB: [birth year]  12/01/2019  [name] was observed post Covid-19 immunization for 15 minutes without incident. She was provided with Vaccine Information Sheet and instruction to access the V-Safe system.   [name] was instructed to call 911 with any severe reactions post vaccine: Marland Kitchen Difficulty breathing  . Swelling of face and throat  . A fast heartbeat  . A bad rash all over body  . Dizziness and weakness   Immunizations Administered    Name Date Dose VIS Date Route   Pfizer COVID-19 Vaccine 12/01/2019 10:26 AM 0.3 mL 05/20/2018 Intramuscular   Manufacturer: [employer], [employer]   Lot: [id number]   NDC: [id number]

## 2019-12-26 ENCOUNTER — Other Ambulatory Visit: Payer: Self-pay

## 2019-12-26 ENCOUNTER — Encounter (HOSPITAL_COMMUNITY): Payer: Self-pay

## 2019-12-26 ENCOUNTER — Emergency Department (HOSPITAL_COMMUNITY)
Admission: EM | Admit: 2019-12-26 | Discharge: 2019-12-26 | Disposition: A | Discharge status: Home / Self Care | Payer: Medicaid Other | Attending: Emergency Medicine | Admitting: Emergency Medicine

## 2019-12-26 DIAGNOSIS — R110 Nausea: Secondary | ICD-10-CM | POA: Insufficient documentation

## 2019-12-26 DIAGNOSIS — Z3202 Encounter for pregnancy test, result negative: Secondary | ICD-10-CM | POA: Insufficient documentation

## 2019-12-26 LAB — I-STAT BETA HCG BLOOD, ED (MC, WL, AP ONLY): I-stat hCG, quantitative: <5 m[IU]/mL (ref <5)

## 2019-12-26 NOTE — ED Triage Notes (Signed)
Pt presents with c/o possible pregnancy. Pt reports her LMP was the end of July. Pt is on Depo for birth control.

## 2019-12-26 NOTE — Discharge Instructions (Signed)
Your serum pregnancy test is negative.  Follow up with your Physician for recheck.

## 2019-12-26 NOTE — ED Provider Notes (Signed)
[facility]-EMERGENCY DEPT Provider Note   CSN: [id number] Arrival date & time: 12/26/19  2952     History Chief Complaint  Patient presents with  . Possible Pregnancy    Donna Galloway is a 24 y.o. female.  The history is provided by the patient. No language interpreter was used.  Possible Pregnancy This is a new problem. The current episode started yesterday. The problem occurs constantly. The problem has not changed since onset.Pertinent negatives include no abdominal pain. Nothing aggravates the symptoms. Nothing relieves the symptoms.   Pt is on depo injections.  Pt reports she had a positive home pregnancy test yesterday.  Pt request serum pregnancy     History reviewed. No pertinent past medical history.  There are no problems to display for this patient.   History reviewed. No pertinent surgical history.   OB History   No obstetric history on file.     History reviewed. No pertinent family history.  Social History   Tobacco Use  . Smoking status: Current Some Day Smoker  . Tobacco comment: black & milds  Substance Use Topics  . Alcohol use: Yes    Comment: occasionally  . Drug use: No    Home Medications Prior to Admission medications   Medication Sig Start Date End Date Taking? Authorizing Provider  benzonatate (TESSALON) 100 MG capsule Take 1 capsule (100 mg total) by mouth every 8 (eight) hours. 03/16/18   Janne Napoleon, NP  oseltamivir (TAMIFLU) 75 MG capsule Take 1 capsule (75 mg total) by mouth every 12 (twelve) hours. 03/16/18   Janne Napoleon, NP    Allergies    Acetic acid and Penicillins  Review of Systems   Review of Systems  Gastrointestinal: Negative for abdominal pain.  All other systems reviewed and are negative.   Physical Exam Updated Vital Signs BP 120/70 (BP Location: Right Arm)   Pulse 80   Temp 98 F (36.7 C)   Resp 16   LMP 10/21/2019 (Approximate)   SpO2 99%   Physical Exam Vitals and nursing  note reviewed.  Constitutional:      Appearance: She is well-developed.  HENT:     Head: Normocephalic.  Cardiovascular:     Rate and Rhythm: Normal rate.  Pulmonary:     Effort: Pulmonary effort is normal.  Abdominal:     General: There is no distension.     Palpations: Abdomen is soft.  Musculoskeletal:        General: Normal range of motion.     Cervical back: Normal range of motion.  Neurological:     Mental Status: She is alert and oriented to person, place, and time.     ED Results / Procedures / Treatments   Labs (all labs ordered are listed, but only abnormal results are displayed) Labs Reviewed  I-STAT BETA HCG BLOOD, ED ([facility], [facility], [facility] ONLY)    EKG None  Radiology No results found.  Procedures Procedures (including critical care time)  Medications Ordered in ED Medications - No data to display  ED Course  I have reviewed the triage vital signs and the nursing notes.  Pertinent labs & imaging results that were available during my care of the patient were reviewed by me and considered in my medical decision making (see chart for details).    MDM Rules/Calculators/A&P                          MDM:  Pregnancy test is negative  Final Clinical Impression(s) / ED Diagnoses Final diagnoses:  Pregnancy examination or test, negative result  Nausea    Rx / DC Orders ED Discharge Orders    None    An After Visit Summary was printed and given to the patient.    Elson Areas, PA-C 12/26/19 1452    Rolan Bucco, MD 12/26/19 [phone]

## 2020-03-30 ENCOUNTER — Other Ambulatory Visit: Payer: Self-pay | Admitting: Obstetrics

## 2020-03-30 DIAGNOSIS — R1011 Right upper quadrant pain: Secondary | ICD-10-CM

## 2020-04-14 ENCOUNTER — Other Ambulatory Visit: Payer: Medicaid Other

## 2022-01-19 ENCOUNTER — Ambulatory Visit (INDEPENDENT_AMBULATORY_CARE_PROVIDER_SITE_OTHER): Payer: BC Managed Care – PPO | Admitting: Family

## 2022-01-19 ENCOUNTER — Encounter: Payer: Self-pay | Admitting: Family

## 2022-01-19 VITALS — BP 100/70 | HR 70 | Temp 97.7°F | Ht 64.5 in | Wt 166.5 lb

## 2022-01-19 DIAGNOSIS — Z23 Encounter for immunization: Secondary | ICD-10-CM

## 2022-01-19 DIAGNOSIS — Z30011 Encounter for initial prescription of contraceptive pills: Secondary | ICD-10-CM | POA: Diagnosis not present

## 2022-01-19 DIAGNOSIS — Z3009 Encounter for other general counseling and advice on contraception: Secondary | ICD-10-CM

## 2022-01-19 MED ORDER — NORETHINDRONE 0.35 MG PO TABS: 1.0000 | ORAL_TABLET | Freq: Every day | ORAL | 11 refills | Status: DC

## 2022-01-19 NOTE — Progress Notes (Signed)
New Patient Office Visit  Subjective:  Patient ID: Donna Galloway, female    DOB: [birth year]  Age: 26 y.o. MRN: [id number]  CC:  Chief Complaint  Patient presents with   Establish Care   Annual Exam    Discuss weight and birth control options.    HPI Donna Galloway presents for establishing care today.   Contraception:  reports having use Depo in past, sister has Nexplanon, interested in this or IUD, concerned about weight gain. She gained 15lbs on Depo, last one a year ago. She has migraines w/aura and told she should not take estrogen.  Weight gain:  used to weigh 145lbs increased to 160 after starting Depo. She has cut her calories to 1200-1300, exercising, tries to stop eating around 8:30 at night, eating fruit smoothies, grits, eggs, sausage for breakfast.   Assessment & Plan:   Problem List Items Addressed This Visit   None Visit Diagnoses     Encounter for initial prescription of contraceptive pills    -  Primary pt has been on progesterone pills in past, would like to start until she can see GYN for IUD or Nexplanon. Sending in [place], pt understands use & SE.    Relevant Medications   norethindrone (ORTHO MICRONOR) 0.35 MG tablet   Need for prophylactic vaccination with combined diphtheria-tetanus-pertussis (DTP) vaccine       Relevant Orders   Tdap vaccine greater than or equal to 7yo IM (Completed)   Need for immunization against influenza       Relevant Orders   Flu Vaccine QUAD 66mo+IM (Fluarix, Fluzone & Alfiuria Quad PF) (Completed)   Birth control counseling    - pt wants to further discuss IUD vs. Nexplanon. Discussed procedures, use & SE briefly today.    Relevant Orders   Ambulatory referral to Gynecology      Subjective:    Outpatient Medications Prior to Visit  Medication Sig Dispense Refill   Acetaminophen-Caff-Pyrilamine (MIDOL COMPLETE PO) Take 1,000 mg by mouth as needed (menstrual cycles).     ibuprofen (ADVIL) 200 MG tablet Take 600-800  mg by mouth every 6 (six) hours as needed for moderate pain.     Multiple Vitamins-Minerals (HAIR SKIN AND NAILS FORMULA) TABS Take 1 tablet by mouth daily in the afternoon.     benzonatate (TESSALON) 100 MG capsule Take 1 capsule (100 mg total) by mouth every 8 (eight) hours. 21 capsule 0   oseltamivir (TAMIFLU) 75 MG capsule Take 1 capsule (75 mg total) by mouth every 12 (twelve) hours. 10 capsule 0   No facility-administered medications prior to visit.   Past Medical History:  Diagnosis Date   Anxiety    Depression    History reviewed. No pertinent surgical history.  Objective:   Today's Vitals: BP 100/70 (BP Location: Left Arm, Patient Position: Sitting, Cuff Size: Normal)   Pulse 70   Temp 97.7 F (36.5 C) (Temporal)   Ht 5' 4.5" (1.638 m)   Wt 166 lb 8 oz (75.5 kg)   LMP 01/14/2022 (Exact Date)   SpO2 98%   BMI 28.14 kg/m   Physical Exam Vitals and nursing note reviewed.  Constitutional:      Appearance: Normal appearance.  Cardiovascular:     Rate and Rhythm: Normal rate and regular rhythm.  Pulmonary:     Effort: Pulmonary effort is normal.     Breath sounds: Normal breath sounds.  Musculoskeletal:        General: Normal range of motion.  Skin:  General: Skin is warm and dry.  Neurological:     Mental Status: She is alert.  Psychiatric:        Mood and Affect: Mood normal.        Behavior: Behavior normal.    Meds ordered this encounter  Medications   norethindrone (ORTHO MICRONOR) 0.35 MG tablet    Sig: Take 1 tablet (0.35 mg total) by mouth daily.    Dispense:  28 tablet    Refill:  11    Order Specific Question:   Supervising Provider    Answer:   ANDY, CAMILLE L [[id number]]    Dulce Sellar, NP

## 2022-01-19 NOTE — Patient Instructions (Signed)
Welcome to [facility], It was a pleasure meeting you today!    As discussed, I have sent your birth control pills to your pharmacy.  I have sent a referral to Gynecology to further discuss birth control options.  Please schedule a physical with fasting labs today.    PLEASE NOTE: If you had any LAB tests please let us know if you have not heard back within a few days. You may see your results on MyChart before we have a chance to review them but we will give you a call once they are reviewed by [place]. If we ordered any REFERRALS today, please let us know if you have not heard from their office within the next week.  Let us know through MyChart if you are needing REFILLS, or have your pharmacy send [place] the request. You can also use MyChart to communicate with me or any office staff.

## 2022-01-25 ENCOUNTER — Encounter: Payer: Self-pay | Admitting: Family

## 2022-01-25 ENCOUNTER — Ambulatory Visit (INDEPENDENT_AMBULATORY_CARE_PROVIDER_SITE_OTHER): Payer: BC Managed Care – PPO | Admitting: Family

## 2022-01-25 VITALS — BP 93/60 | HR 79 | Temp 97.8°F | Ht 64.5 in | Wt 167.0 lb

## 2022-01-25 DIAGNOSIS — N6310 Unspecified lump in the right breast, unspecified quadrant: Secondary | ICD-10-CM | POA: Diagnosis not present

## 2022-01-25 DIAGNOSIS — N644 Mastodynia: Secondary | ICD-10-CM

## 2022-01-25 NOTE — Progress Notes (Signed)
   Patient ID: Donna Galloway, female    DOB: 10-Nov-1995, 26 y.o.   MRN: [id number]  Chief Complaint  Patient presents with   Breast Pain    w/ palpable lump    HPI:      Right breast pain:  Pt c/o Right sided breast pain present for a week. Pt states she felt a lump and it is tender when she pushes on it, also reports general soreness in breast and upper chest on that side.   Assessment & Plan:  1. Painful lumpy right breast denies having in past, reports her menses ending a week ago. 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 Pt has very large breasts, current bra size G. Will order [place] to confirm.  - US BREAST LTD UNI RIGHT INC AXILLA; Future   Subjective:    Outpatient Medications Prior to Visit  Medication Sig Dispense Refill   Acetaminophen-Caff-Pyrilamine (MIDOL COMPLETE PO) Take 1,000 mg by mouth as needed (menstrual cycles).     ibuprofen (ADVIL) 200 MG tablet Take 600-800 mg by mouth every 6 (six) hours as needed for moderate pain.     Multiple Vitamins-Minerals (HAIR SKIN AND NAILS FORMULA) TABS Take 1 tablet by mouth daily in the afternoon.     norethindrone (ORTHO MICRONOR) 0.35 MG tablet Take 1 tablet (0.35 mg total) by mouth daily. 28 tablet 11   No facility-administered medications prior to visit.   Past Medical History:  Diagnosis Date   Anxiety    Depression    No past surgical history on file. Allergies  Allergen Reactions   Acetic Acid    Citrus    Penicillins Rash      Objective:    Physical Exam Vitals and nursing note reviewed.  Constitutional:      Appearance: Normal appearance.  Cardiovascular:     Rate and Rhythm: Normal rate and regular rhythm.  Pulmonary:     Effort: Pulmonary effort is normal.     Breath sounds: Normal breath sounds.  Chest:  Breasts:    Right: Mass (approx 2.5cm diameter, firm, tender lump palpable in 8-9:00 region, laterally)  present. No swelling, inverted nipple or skin change.     Left: No swelling, inverted nipple or skin change.  Musculoskeletal:        General: Normal range of motion.  Skin:    General: Skin is warm and dry.  Neurological:     Mental Status: She is alert.  Psychiatric:        Mood and Affect: Mood normal.        Behavior: Behavior normal.    BP 93/60 (BP Location: Left Arm, Patient Position: Sitting, Cuff Size: Large)   Pulse 79   Temp 97.8 F (36.6 C) (Temporal)   Ht 5' 4.5" (1.638 m)   Wt 167 lb (75.8 kg)   LMP 01/14/2022 (Exact Date)   SpO2 97%   BMI 28.22 kg/m  Wt Readings from Last 3 Encounters:  01/25/22 167 lb (75.8 kg)  01/19/22 166 lb 8 oz (75.5 kg)  03/16/18 140 lb (63.5 kg)       Jeanie Sewer, NP

## 2022-01-25 NOTE — Patient Instructions (Signed)
It was very nice to see you today!   I will review your lab results via MyChart in a few days.   Have a great rest of the week!   PLEASE NOTE:  If you had any lab tests please let us know if you have not heard back within a few days. You may see your results on MyChart before we have a chance to review them but we will give you a call once they are reviewed by us. If we ordered any referrals today, please let us know if you have not heard from their office within the next week.    

## 2022-02-07 ENCOUNTER — Ambulatory Visit (INDEPENDENT_AMBULATORY_CARE_PROVIDER_SITE_OTHER): Payer: BC Managed Care – PPO | Admitting: Nurse Practitioner

## 2022-02-07 ENCOUNTER — Encounter: Payer: Self-pay | Admitting: Nurse Practitioner

## 2022-02-07 ENCOUNTER — Other Ambulatory Visit (HOSPITAL_COMMUNITY)
Admission: RE | Admit: 2022-02-07 | Discharge: 2022-02-07 | Disposition: A | Discharge status: Home / Self Care | Payer: BC Managed Care – PPO | Source: Ambulatory Visit | Attending: Nurse Practitioner | Admitting: Nurse Practitioner

## 2022-02-07 VITALS — BP 124/82 | HR 82 | Resp 20 | Ht 63.39 in | Wt 164.4 lb

## 2022-02-07 DIAGNOSIS — Z01419 Encounter for gynecological examination (general) (routine) without abnormal findings: Secondary | ICD-10-CM

## 2022-02-07 DIAGNOSIS — Z3009 Encounter for other general counseling and advice on contraception: Secondary | ICD-10-CM

## 2022-02-07 DIAGNOSIS — Z113 Encounter for screening for infections with a predominantly sexual mode of transmission: Secondary | ICD-10-CM | POA: Diagnosis not present

## 2022-02-07 NOTE — Progress Notes (Signed)
Donna Galloway [birth year] [id number]   History:  26 y.o. G0 presents as new patient to establish care. Started on POPs a couple of weeks ago, prescribed by PCP. Considering Nexplanon. H/O heavy, painful menses. Heavy bleeding first 3 days with minimal pain, then tapers off and last couple of days have clots and severe pain. Takes Midol with a little relief. Mother with history of fibroids. H/O migraines with aura. Scheduled for diagnostic imaging of right breast in December for lump. Normal pap history.   Gynecologic History Patient's last menstrual period was 02/07/2022 (exact date). Period Pattern: Regular Menstrual Flow: Heavy Menstrual Control: Maxi pad Menstrual Control Change Freq (Hours): 6-8 Dysmenorrhea: (!) Severe Dysmenorrhea Symptoms: Cramping, Nausea, Diarrhea, Headache Contraception/Family planning: oral progesterone-only contraceptive Sexually active: Yes  Health Maintenance Last Pap: age 26. Results were: Normal Last mammogram: Not indicated Last colonoscopy: Not indicated Last Dexa: Not indicated   Past medical history, past surgical history, family history and social history were all reviewed and documented in the EPIC chart. Works remote doing Producer, television/film/video, also works as Production assistant, radio.   ROS:  A ROS was performed and pertinent positives and negatives are included.  Exam:  Vitals:   02/07/22 1355  BP: 124/82  Pulse: 82  Resp: 20  SpO2: 98%  Weight: 164 lb 6.4 oz (74.6 kg)  Height: 5' 3.39" (1.61 m)   Body mass index is 28.77 kg/m.  General appearance:  Normal Thyroid:  Symmetrical, normal in size, without palpable masses or nodularity. Respiratory  Auscultation:  Clear without wheezing or rhonchi Cardiovascular  Auscultation:  Regular rate, without rubs, murmurs or gallops  Edema/varicosities:  Not grossly evident Abdominal  Soft,nontender, without masses, guarding or rebound.  Liver/spleen:  No organomegaly noted  Hernia:  None appreciated   Skin  Inspection:  Grossly normal Breasts: Examined lying and sitting.   Right: Tender, firm mass ~ 2 cm @ 8 o'clock. No retractions, nipple discharge or axillary adenopathy.  Left: Without masses, retractions, nipple discharge or axillary adenopathy. Genitourinary   Inguinal/mons:  Normal without inguinal adenopathy  External genitalia:  Normal appearing vulva with no masses, tenderness, or lesions  BUS/Urethra/Skene's glands:  Normal  Vagina:  Normal appearing with normal color and discharge, no lesions  Cervix:  Normal appearing without discharge or lesions  Uterus:  Normal in size, shape and contour.  Midline and mobile, nontender  Adnexa/parametria:     Rt: Normal in size, without masses or tenderness.   Lt: Normal in size, without masses or tenderness.  Anus and perineum: Normal  Digital rectal exam: Not indicated  Patient informed chaperone available to be present for breast and pelvic exam. Patient has requested no chaperone to be present. Patient has been advised what will be completed during breast and pelvic exam.   Assessment/Plan:  26 y.o. G0 to establish care.   Well female exam with routine gynecological exam - Plan: Cytology - PAP( [place]). Education provided on SBEs, importance of preventative screenings, current guidelines, high calcium diet, regular exercise, and multivitamin daily.  Labs with PCP.   Screening examination for STD (sexually transmitted disease) - Plan: Cytology - PAP( [place]), RPR, HIV Antibody (routine testing w rflx). GC/CT/trich added to pap.   General counseling and advice on female contraception - Started on POPs 2 weeks ago. Had weight gain with Depo in the pain. Has heavy, painful periods. Contraceptive options were reviewed, including hormonal methods, both combination (pill, patch, vaginal ring) and progesterone-only (pill, Depo Provera and Nexplanon), intrauterine devices (Mirena,  Erlene Quan, and Paraguard), Phexxi, barrier methods  (condoms, diaphragm) and female/female sterilization. The mechanisms, risks, benefits and side effects of all methods were discussed. Will avoid estrogen due to h/o migraine with aura. Considering Nexplanon.   Screening for cervical cancer - Normal Pap history.  Pap today.  Return in 1 year for annual.     Olivia Mackie DNP, 2:39 PM 02/07/2022

## 2022-02-08 LAB — RPR: RPR Ser Ql: NONREACTIVE

## 2022-02-08 LAB — HIV ANTIBODY (ROUTINE TESTING W REFLEX): HIV 1&2 Ab, 4th Generation: NONREACTIVE

## 2022-02-09 ENCOUNTER — Encounter: Payer: Self-pay | Admitting: Family

## 2022-02-09 ENCOUNTER — Ambulatory Visit (INDEPENDENT_AMBULATORY_CARE_PROVIDER_SITE_OTHER): Payer: BC Managed Care – PPO | Admitting: Family

## 2022-02-09 VITALS — BP 118/78 | HR 65 | Temp 97.7°F | Ht 63.0 in | Wt 164.8 lb

## 2022-02-09 DIAGNOSIS — Z0000 Encounter for general adult medical examination without abnormal findings: Secondary | ICD-10-CM

## 2022-02-09 DIAGNOSIS — N62 Hypertrophy of breast: Secondary | ICD-10-CM

## 2022-02-09 LAB — COMPREHENSIVE METABOLIC PANEL
ALT: 10 U/L (ref 0–35)
AST: 13 U/L (ref 0–37)
Albumin: 4.1 g/dL (ref 3.5–5.2)
Alkaline Phosphatase: 54 U/L (ref 39–117)
BUN: 16 mg/dL (ref 6–23)
CO2: 25 mEq/L (ref 19–32)
Calcium: 8.7 mg/dL (ref 8.4–10.5)
Chloride: 109 mEq/L (ref 96–112)
Creatinine, Ser: 0.85 mg/dL (ref 0.40–1.20)
GFR: 93.76 mL/min (ref >60.00)
Glucose, Bld: 94 mg/dL (ref 70–99)
Potassium: 4.1 mEq/L (ref 3.5–5.1)
Sodium: 139 mEq/L (ref 135–145)
Total Bilirubin: 0.4 mg/dL (ref 0.2–1.2)
Total Protein: 7 g/dL (ref 6.0–8.3)

## 2022-02-09 LAB — CBC WITH DIFFERENTIAL/PLATELET
Basophils Absolute: 0 10*3/uL (ref 0.0–0.1)
Basophils Relative: 0.8 % (ref 0.0–3.0)
Eosinophils Absolute: 0.1 10*3/uL (ref 0.0–0.7)
Eosinophils Relative: 1.3 % (ref 0.0–5.0)
HCT: 38.5 % (ref 36.0–46.0)
Hemoglobin: 12.6 g/dL (ref 12.0–15.0)
Lymphocytes Relative: 54.4 % — ABNORMAL HIGH (ref 12.0–46.0)
Lymphs Abs: 2.4 10*3/uL (ref 0.7–4.0)
MCHC: 32.8 g/dL (ref 30.0–36.0)
MCV: 90.1 fl (ref 78.0–100.0)
Monocytes Absolute: 0.3 10*3/uL (ref 0.1–1.0)
Monocytes Relative: 6.3 % (ref 3.0–12.0)
Neutro Abs: 1.6 10*3/uL (ref 1.4–7.7)
Neutrophils Relative %: 37.2 % — ABNORMAL LOW (ref 43.0–77.0)
Platelets: 201 10*3/uL (ref 150.0–400.0)
RBC: 4.27 Mil/uL (ref 3.87–5.11)
RDW: 11.8 % (ref 11.5–15.5)
WBC: 4.4 10*3/uL (ref 4.0–10.5)

## 2022-02-09 LAB — CYTOLOGY - PAP
Chlamydia: NEGATIVE
Comment: NEGATIVE
Comment: NEGATIVE
Comment: NORMAL
Diagnosis: NEGATIVE
Neisseria Gonorrhea: NEGATIVE
Trichomonas: NEGATIVE

## 2022-02-09 LAB — LIPID PANEL
Cholesterol: 151 mg/dL (ref 0–200)
HDL: 47.5 mg/dL (ref >39.00)
LDL Cholesterol: 96 mg/dL (ref 0–99)
NonHDL: 103.82
Total CHOL/HDL Ratio: 3
Triglycerides: 38 mg/dL (ref 0.0–149.0)
VLDL: 7.6 mg/dL (ref 0.0–40.0)

## 2022-02-09 LAB — TSH: TSH: 0.59 u[IU]/mL (ref 0.35–5.50)

## 2022-02-09 NOTE — Progress Notes (Signed)
Phone [phone]  Subjective:   Patient is a 26 y.o. female presenting for annual physical.    Chief Complaint  Patient presents with   Annual Exam    Fasting w/ Labs    See problem oriented charting- ROS- full  review of systems was completed and negative.   The following were reviewed and entered/updated in epic: Past Medical History:  Diagnosis Date   Anxiety    Depression    Patient Active Problem List   Diagnosis Date Noted   Migraine 11/16/2010   No past surgical history on file.  Family History  Problem Relation Age of Onset   Fibroids Mother    Heart disease Father     Medications- reviewed and updated Current Outpatient Medications  Medication Sig Dispense Refill   Acetaminophen-Caff-Pyrilamine (MIDOL COMPLETE PO) Take 1,000 mg by mouth as needed (menstrual cycles).     ibuprofen (ADVIL) 200 MG tablet Take 600-800 mg by mouth every 6 (six) hours as needed for moderate pain.     Multiple Vitamins-Minerals (HAIR SKIN AND NAILS FORMULA) TABS Take 1 tablet by mouth daily in the afternoon.     norethindrone (ORTHO MICRONOR) 0.35 MG tablet Take 1 tablet (0.35 mg total) by mouth daily. 28 tablet 11   No current facility-administered medications for this visit.    Allergies-reviewed and updated Allergies  Allergen Reactions   Acetic Acid    Citrus    Penicillins Rash    Social History   Social History Narrative   Not on file    Objective:  BP 118/78 (BP Location: Left Arm, Patient Position: Sitting, Cuff Size: Large)   Pulse 65   Temp 97.7 F (36.5 C) (Temporal)   Ht _0  (1.6 m)   Wt 164 lb 12.8 oz (74.8 kg)   LMP 02/07/2022 (Exact Date)   SpO2 100%   BMI 29.19 kg/m  Physical Exam Vitals and nursing note reviewed.  Constitutional:      Appearance: Normal appearance.  HENT:     Head: Normocephalic.     Right Ear: Tympanic membrane normal.     Left Ear: Tympanic membrane normal.     Nose: Nose normal.     Mouth/Throat:     Mouth:  Mucous membranes are moist.  Eyes:     Pupils: Pupils are equal, round, and reactive to light.  Cardiovascular:     Rate and Rhythm: Normal rate and regular rhythm.  Pulmonary:     Effort: Pulmonary effort is normal.     Breath sounds: Normal breath sounds.  Musculoskeletal:        General: Normal range of motion.     Cervical back: Normal range of motion.  Lymphadenopathy:     Cervical: No cervical adenopathy.  Skin:    General: Skin is warm and dry.  Neurological:     Mental Status: She is alert.  Psychiatric:        Mood and Affect: Mood normal.        Behavior: Behavior normal.      Assessment and Plan   Health Maintenance counseling: 1. Anticipatory guidance: Patient counseled regarding regular dental exams q6 months, eye exams,  avoiding smoking and second hand smoke, limiting alcohol to 1 beverage per day, no illicit drugs.   2. Risk factor reduction:  Advised patient of need for regular exercise and diet rich with fruits and vegetables to reduce risk of heart attack and stroke. Wt Readings from Last 3 Encounters:  02/09/22 164 lb 12.8  oz (74.8 kg)  02/07/22 164 lb 6.4 oz (74.6 kg)  01/25/22 167 lb (75.8 kg)   3. Immunizations/screenings/ancillary studies Immunization History  Administered Date(s) Administered   DTaP 07/14/1999   H1N1 04/06/2008   HIB, Unspecified 07/14/1999   HPV Quadrivalent 11/09/2005, 12/05/2006, 12/22/2007   Hepatitis A, Adult 11/08/2004, 05/25/2005   Hepatitis B, adult 01-10-1995   IPV 07/14/1999   Influenza, Seasonal, Injecte, Preservative Fre 12/22/2007   Influenza,inj,Quad PF,6+ Mos 01/19/2022   MMR 07/14/1999   PFIZER(Purple Top)SARS-COV-2 Vaccination 11/10/2019, 12/01/2019   Tdap 11/09/2005, 01/19/2022   Varicella 05/25/2005   Health Maintenance Due  Topic Date Due   Hepatitis C Screening  Never done    4. Cervical cancer screening: yesterday at GYN 5. Skin cancer screening- advised regular sunscreen use. Denies worrisome,  changing, or new skin lesions.  6. Birth control/STD check: done recently, neg. 7. Smoking associated screening: non- smoker 8. Alcohol screening: rare  Problem List Items Addressed This Visit   None   Recommended follow up:  No follow-ups on file. Future Appointments  Date Time Provider [place]  03/01/2022  7:30 AM [facility] [place] [facility]    Lab/Order associations:fasting    Jeanie Sewer, NP

## 2022-02-09 NOTE — Patient Instructions (Signed)
It was very nice to see you today!   I will review your lab results via MyChart in a few days.   Have a great weekend!    PLEASE NOTE:  If you had any lab tests please let us know if you have not heard back within a few days. You may see your results on MyChart before we have a chance to review them but we will give you a call once they are reviewed by us. If we ordered any referrals today, please let us know if you have not heard from their office within the next week.    

## 2022-02-13 NOTE — Progress Notes (Signed)
Your glucose (sugar), electrolytes, kidney and liver function, blood count, thyroid, and cholesterol numbers are all good.  Your neutrophils & lymphocytes are showing abnormal due to a recent virus or allergy symptoms, it is not concerning, if rechecked in another week they would be normal!   Also, I got your message about the breast reduction referral, and this has been sent, they will call you directly to schedule. Keep up the good work with controlling your diet and continue to try and shoot for 30 minutes of exercise daily!

## 2022-02-13 NOTE — Telephone Encounter (Signed)
I responded to her first message via her lab result note.

## 2022-02-13 NOTE — Addendum Note (Signed)
Addended byDulce Sellar on: 02/13/2022 05:50 PM   Modules accepted: Orders

## 2022-03-01 ENCOUNTER — Ambulatory Visit
Admission: RE | Admit: 2022-03-01 | Discharge: 2022-03-01 | Disposition: A | Discharge status: Home / Self Care | Payer: BC Managed Care – PPO | Source: Ambulatory Visit | Attending: Family | Admitting: Family

## 2022-03-01 DIAGNOSIS — N6310 Unspecified lump in the right breast, unspecified quadrant: Secondary | ICD-10-CM

## 2022-03-01 DIAGNOSIS — N644 Mastodynia: Secondary | ICD-10-CM | POA: Diagnosis not present

## 2022-03-01 NOTE — Progress Notes (Signed)
Glad to see your ultrasound turned out normal, no mass noted!  Take care :-)

## 2022-03-09 ENCOUNTER — Institutional Professional Consult (permissible substitution): Payer: BC Managed Care – PPO | Admitting: Plastic Surgery

## 2022-03-20 ENCOUNTER — Ambulatory Visit (INDEPENDENT_AMBULATORY_CARE_PROVIDER_SITE_OTHER): Payer: BC Managed Care – PPO | Admitting: Plastic Surgery

## 2022-03-20 ENCOUNTER — Encounter: Payer: Self-pay | Admitting: Plastic Surgery

## 2022-03-20 VITALS — BP 108/71 | HR 68 | Ht 64.0 in | Wt 170.0 lb

## 2022-03-20 DIAGNOSIS — G8929 Other chronic pain: Secondary | ICD-10-CM

## 2022-03-20 DIAGNOSIS — Z6829 Body mass index (BMI) 29.0-29.9, adult: Secondary | ICD-10-CM

## 2022-03-20 DIAGNOSIS — M546 Pain in thoracic spine: Secondary | ICD-10-CM | POA: Diagnosis not present

## 2022-03-20 DIAGNOSIS — N62 Hypertrophy of breast: Secondary | ICD-10-CM | POA: Diagnosis not present

## 2022-03-20 DIAGNOSIS — F1290 Cannabis use, unspecified, uncomplicated: Secondary | ICD-10-CM

## 2022-03-20 DIAGNOSIS — R21 Rash and other nonspecific skin eruption: Secondary | ICD-10-CM

## 2022-03-20 DIAGNOSIS — M542 Cervicalgia: Secondary | ICD-10-CM | POA: Diagnosis not present

## 2022-03-20 DIAGNOSIS — M545 Low back pain, unspecified: Secondary | ICD-10-CM

## 2022-03-20 DIAGNOSIS — M549 Dorsalgia, unspecified: Secondary | ICD-10-CM | POA: Insufficient documentation

## 2022-03-20 NOTE — Progress Notes (Addendum)
Patient ID: Donna Galloway, female    DOB: 1995/04/19, 26 y.o.   MRN: [id number]   Chief Complaint  Patient presents with   consult   Breast Problem    Mammary Hyperplasia: The patient is a 26 y.o. female with a history of mammary hyperplasia for several years.  She has extremely large breasts causing symptoms that include the following: Back pain in the upper and lower back, including neck pain. She pulls or pins her bra straps to provide better lift and relief of the pressure and pain. She notices relief by holding her breast up manually.  Her shoulder straps cause grooves and pain and pressure that requires padding for relief. Pain medication is sometimes required with motrin and tylenol.  Activities that are hindered by enlarged breasts include: exercise and running.  She has tried supportive clothing as well as fitted bras without improvement.  Her breasts are extremely large and fairly symmetric.  She has hyperpigmentation of the inframammary area on both sides.  The sternal to nipple distance on the right is 33 cm and the left is 34 cm.  The IMF distance is 16 cm.  She is 5 feet 3 inches tall and weighs 164 pounds.  The BMI = 29.1 kg/m.  Preoperative bra size = 32G cup.  The estimated excess breast tissue to be removed at the time of surgery = 490 grams on the left and 490 grams on the right.  Mammogram history: 2 weeks ago because she thought she might have felt a lump on the right lateral breast.  Mammogram was negative.  Family history of breast cancer: None.  Tobacco use: Marijuana but not tobacco.   The patient expresses the desire to pursue surgical intervention.     Review of Systems  Constitutional:  Positive for activity change. Negative for appetite change.  Eyes: Negative.   Respiratory: Negative.  Negative for chest tightness and shortness of breath.   Cardiovascular: Negative.   Gastrointestinal: Negative.   Endocrine: Negative.   Genitourinary: Negative.    Musculoskeletal:  Positive for back pain and neck pain.  Skin:  Positive for rash.  Psychiatric/Behavioral: Negative.      Past Medical History:  Diagnosis Date   Anxiety    Depression     History reviewed. No pertinent surgical history.    Current Outpatient Medications:    Acetaminophen-Caff-Pyrilamine (MIDOL COMPLETE PO), Take 1,000 mg by mouth as needed (menstrual cycles)., Disp: , Rfl:    ibuprofen (ADVIL) 200 MG tablet, Take 600-800 mg by mouth every 6 (six) hours as needed for moderate pain., Disp: , Rfl:    Multiple Vitamins-Minerals (HAIR SKIN AND NAILS FORMULA) TABS, Take 1 tablet by mouth daily in the afternoon., Disp: , Rfl:    norethindrone (ORTHO MICRONOR) 0.35 MG tablet, Take 1 tablet (0.35 mg total) by mouth daily., Disp: 28 tablet, Rfl: 11   Objective:   Vitals:   03/20/22 1030  BP: 108/71  Pulse: 68  SpO2: 96%    Physical Exam Vitals and nursing note reviewed.  Constitutional:      Appearance: Normal appearance.  HENT:     Head: Normocephalic and atraumatic.  Cardiovascular:     Rate and Rhythm: Normal rate.     Pulses: Normal pulses.  Pulmonary:     Effort: Pulmonary effort is normal.  Abdominal:     General: There is no distension.     Palpations: Abdomen is soft.     Tenderness: There is no abdominal  tenderness.  Musculoskeletal:        General: No swelling or deformity.  Skin:    General: Skin is warm.     Capillary Refill: Capillary refill takes less than 2 seconds.     Coloration: Skin is not jaundiced or pale.     Findings: No bruising, erythema or lesion.  Neurological:     Mental Status: She is alert and oriented to person, place, and time.  Psychiatric:        Mood and Affect: Mood normal.        Behavior: Behavior normal.        Thought Content: Thought content normal.        Judgment: Judgment normal.     Assessment & Plan:  Symptomatic mammary hypertrophy  Chronic bilateral thoracic back pain  Neck pain  The procedure  the patient selected and that was best for the patient was discussed. The risk were discussed and include but not limited to the following:  Breast asymmetry, fluid accumulation, firmness of the breast, inability to breast feed, loss of nipple or areola, skin loss, change in skin and nipple sensation, fat necrosis of the breast tissue, bleeding, infection and healing delay.  There are risks of anesthesia and injury to nerves or blood vessels.  Allergic reaction to tape, suture and skin glue are possible.  There will be swelling.  Any of these can lead to the need for revisional surgery.  A breast reduction has potential to interfere with diagnostic procedures in the future.  This procedure is best done when the breast is fully developed.  Changes in the breast will continue to occur over time: pregnancy, weight gain or weigh loss.    Total time: 40 minutes. This includes time spent with the patient during the visit as well as time spent before and after the visit reviewing the chart, documenting the encounter, ordering pertinent studies and literature for the patient.   Physical therapy:  ordered Mammogram:  done  Patient is a good candidate for bilateral breast reduction with possible liposuction.  She is going to do the physical therapy and then come and see [place] in February.  Pictures were obtained of the patient and placed in the chart with the patient's or guardian's permission.   Alena Bills Bobbi Kozakiewicz, DO

## 2022-04-05 ENCOUNTER — Other Ambulatory Visit: Payer: Self-pay

## 2022-04-05 ENCOUNTER — Ambulatory Visit (HOSPITAL_BASED_OUTPATIENT_CLINIC_OR_DEPARTMENT_OTHER): Payer: BC Managed Care – PPO | Attending: Plastic Surgery | Admitting: Physical Therapy

## 2022-04-05 ENCOUNTER — Encounter (HOSPITAL_BASED_OUTPATIENT_CLINIC_OR_DEPARTMENT_OTHER): Payer: Self-pay | Admitting: Physical Therapy

## 2022-04-05 DIAGNOSIS — M542 Cervicalgia: Secondary | ICD-10-CM | POA: Diagnosis not present

## 2022-04-05 DIAGNOSIS — M546 Pain in thoracic spine: Secondary | ICD-10-CM | POA: Diagnosis not present

## 2022-04-05 DIAGNOSIS — N62 Hypertrophy of breast: Secondary | ICD-10-CM | POA: Diagnosis not present

## 2022-04-05 DIAGNOSIS — G8929 Other chronic pain: Secondary | ICD-10-CM | POA: Diagnosis not present
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
## 2022-04-12 ENCOUNTER — Ambulatory Visit (HOSPITAL_BASED_OUTPATIENT_CLINIC_OR_DEPARTMENT_OTHER): Payer: BC Managed Care – PPO

## 2022-04-18 ENCOUNTER — Encounter (HOSPITAL_BASED_OUTPATIENT_CLINIC_OR_DEPARTMENT_OTHER): Payer: Self-pay | Admitting: Physical Therapy

## 2022-04-18 ENCOUNTER — Ambulatory Visit (HOSPITAL_BASED_OUTPATIENT_CLINIC_OR_DEPARTMENT_OTHER): Payer: BC Managed Care – PPO | Admitting: Physical Therapy

## 2022-04-18 DIAGNOSIS — M546 Pain in thoracic spine: Secondary | ICD-10-CM | POA: Diagnosis not present

## 2022-04-18 DIAGNOSIS — N62 Hypertrophy of breast: Secondary | ICD-10-CM | POA: Diagnosis not present

## 2022-04-18 DIAGNOSIS — M542 Cervicalgia: Secondary | ICD-10-CM

## 2022-04-18 DIAGNOSIS — G8929 Other chronic pain: Secondary | ICD-10-CM | POA: Diagnosis not present
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
## 2022-04-19 ENCOUNTER — Encounter (HOSPITAL_BASED_OUTPATIENT_CLINIC_OR_DEPARTMENT_OTHER): Payer: BC Managed Care – PPO

## 2022-04-23 ENCOUNTER — Encounter (HOSPITAL_BASED_OUTPATIENT_CLINIC_OR_DEPARTMENT_OTHER): Payer: Self-pay

## 2022-04-23 ENCOUNTER — Ambulatory Visit (HOSPITAL_BASED_OUTPATIENT_CLINIC_OR_DEPARTMENT_OTHER): Payer: BC Managed Care – PPO

## 2022-04-23 DIAGNOSIS — M542 Cervicalgia: Secondary | ICD-10-CM

## 2022-04-23 DIAGNOSIS — N62 Hypertrophy of breast: Secondary | ICD-10-CM | POA: Diagnosis not present

## 2022-04-23 DIAGNOSIS — M546 Pain in thoracic spine: Secondary | ICD-10-CM | POA: Diagnosis not present

## 2022-04-23 DIAGNOSIS — G8929 Other chronic pain: Secondary | ICD-10-CM | POA: Diagnosis not present
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
## 2022-04-26 ENCOUNTER — Encounter (HOSPITAL_BASED_OUTPATIENT_CLINIC_OR_DEPARTMENT_OTHER): Payer: BC Managed Care – PPO

## 2022-04-30 ENCOUNTER — Ambulatory Visit (HOSPITAL_BASED_OUTPATIENT_CLINIC_OR_DEPARTMENT_OTHER): Payer: BC Managed Care – PPO | Attending: Plastic Surgery

## 2022-04-30 ENCOUNTER — Encounter (HOSPITAL_BASED_OUTPATIENT_CLINIC_OR_DEPARTMENT_OTHER): Payer: Self-pay

## 2022-04-30 DIAGNOSIS — M546 Pain in thoracic spine: Secondary | ICD-10-CM | POA: Insufficient documentation

## 2022-04-30 DIAGNOSIS — M542 Cervicalgia: Secondary | ICD-10-CM | POA: Insufficient documentation
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
## 2022-05-03 ENCOUNTER — Encounter (HOSPITAL_BASED_OUTPATIENT_CLINIC_OR_DEPARTMENT_OTHER): Payer: BC Managed Care – PPO

## 2022-05-07 ENCOUNTER — Ambulatory Visit (HOSPITAL_BASED_OUTPATIENT_CLINIC_OR_DEPARTMENT_OTHER): Payer: BC Managed Care – PPO

## 2022-05-07 ENCOUNTER — Encounter (HOSPITAL_BASED_OUTPATIENT_CLINIC_OR_DEPARTMENT_OTHER): Payer: Self-pay

## 2022-05-07 DIAGNOSIS — M546 Pain in thoracic spine: Secondary | ICD-10-CM | POA: Diagnosis not present

## 2022-05-07 DIAGNOSIS — M542 Cervicalgia: Secondary | ICD-10-CM | POA: Diagnosis not present
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
## 2022-05-14 ENCOUNTER — Ambulatory Visit (HOSPITAL_BASED_OUTPATIENT_CLINIC_OR_DEPARTMENT_OTHER): Payer: BC Managed Care – PPO | Admitting: Physical Therapy

## 2022-05-14 ENCOUNTER — Encounter (HOSPITAL_BASED_OUTPATIENT_CLINIC_OR_DEPARTMENT_OTHER): Payer: Self-pay | Admitting: Physical Therapy

## 2022-05-14 DIAGNOSIS — M542 Cervicalgia: Secondary | ICD-10-CM

## 2022-05-14 DIAGNOSIS — M546 Pain in thoracic spine: Secondary | ICD-10-CM
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
## 2022-05-15 ENCOUNTER — Ambulatory Visit: Payer: BC Managed Care – PPO | Admitting: Physician Assistant

## 2022-05-17 ENCOUNTER — Encounter (HOSPITAL_BASED_OUTPATIENT_CLINIC_OR_DEPARTMENT_OTHER): Payer: BC Managed Care – PPO

## 2022-05-18 ENCOUNTER — Ambulatory Visit: Payer: BC Managed Care – PPO | Admitting: Physician Assistant

## 2022-05-21 ENCOUNTER — Ambulatory Visit (HOSPITAL_BASED_OUTPATIENT_CLINIC_OR_DEPARTMENT_OTHER): Payer: BC Managed Care – PPO | Admitting: Physical Therapy

## 2022-05-21 ENCOUNTER — Telehealth: Payer: Self-pay | Admitting: Plastic Surgery

## 2022-05-21 DIAGNOSIS — M542 Cervicalgia: Secondary | ICD-10-CM | POA: Diagnosis not present

## 2022-05-21 DIAGNOSIS — M546 Pain in thoracic spine: Secondary | ICD-10-CM
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
## 2022-05-21 NOTE — Telephone Encounter (Signed)
Patient called is stating that PT is telling her that she needs 12 weeks of PT and the provider told her 6-8 week, pt wants to know what to do

## 2022-05-22 NOTE — Telephone Encounter (Signed)
Sent pt MyChart message and adv her of what is required. Awaiting pt response.

## 2022-05-23 NOTE — Telephone Encounter (Signed)
Tried to call patient to advise she only needs 6 weeks of PT which she has completed. VM is full, unable to leave message. Message was sent via [facility] 05/22/2022. In order to avoid going to [place] for 3 months, patient needs to lose 10 pounds to get her BMI to 27. After these 2 requirements have been met, she needs to call and make a follow up appointment with a PA.

## 2022-05-31 ENCOUNTER — Encounter (HOSPITAL_BASED_OUTPATIENT_CLINIC_OR_DEPARTMENT_OTHER): Payer: BC Managed Care – PPO | Admitting: Physical Therapy

## 2022-06-07 ENCOUNTER — Encounter (HOSPITAL_BASED_OUTPATIENT_CLINIC_OR_DEPARTMENT_OTHER): Payer: BC Managed Care – PPO

## 2022-06-11 ENCOUNTER — Encounter (HOSPITAL_BASED_OUTPATIENT_CLINIC_OR_DEPARTMENT_OTHER): Payer: BC Managed Care – PPO

## 2022-06-14 ENCOUNTER — Encounter (HOSPITAL_BASED_OUTPATIENT_CLINIC_OR_DEPARTMENT_OTHER): Payer: BC Managed Care – PPO

## 2022-06-15 ENCOUNTER — Ambulatory Visit (INDEPENDENT_AMBULATORY_CARE_PROVIDER_SITE_OTHER): Payer: BC Managed Care – PPO | Admitting: Physician Assistant

## 2022-06-15 ENCOUNTER — Ambulatory Visit: Payer: BC Managed Care – PPO | Admitting: Physician Assistant

## 2022-06-15 ENCOUNTER — Encounter: Payer: Self-pay | Admitting: Physician Assistant

## 2022-06-15 VITALS — BP 111/70 | HR 74 | Ht 64.0 in | Wt 158.4 lb

## 2022-06-15 DIAGNOSIS — N62 Hypertrophy of breast: Secondary | ICD-10-CM | POA: Diagnosis not present

## 2022-06-15 DIAGNOSIS — F1290 Cannabis use, unspecified, uncomplicated: Secondary | ICD-10-CM

## 2022-06-15 DIAGNOSIS — Z6829 Body mass index (BMI) 29.0-29.9, adult: Secondary | ICD-10-CM

## 2022-06-15 DIAGNOSIS — M549 Dorsalgia, unspecified: Secondary | ICD-10-CM

## 2022-06-15 DIAGNOSIS — M542 Cervicalgia: Secondary | ICD-10-CM | POA: Diagnosis not present

## 2022-06-15 DIAGNOSIS — R21 Rash and other nonspecific skin eruption: Secondary | ICD-10-CM | POA: Diagnosis not present

## 2022-06-15 NOTE — Progress Notes (Signed)
   Referring Provider Jeanie Sewer, NP [place],  [place] [id number]   CC:  Chief Complaint  Patient presents with   Follow-up      Donna Galloway is an 27 y.o. female.  HPI: This is a 27 year old female seen in our office for follow-up evaluation status post physical therapy for mammary hyperplasia.  The patient was last seen in the office on 03/20/2022 by Dr. Marla Roe.  Since doing physical therapy she notes she still has ongoing pain in the neck back, and is getting rashes under her breast and also has shoulder grooving.  She notes no meaningful impact of the physical therapy on her ongoing symptoms.  Below is a recap of her last office visit.  The patient is a 27 y.o. female with a history of mammary hyperplasia for several years.  She has extremely large breasts causing symptoms that include the following: Back pain in the upper and lower back, including neck pain. She pulls or pins her bra straps to provide better lift and relief of the pressure and pain. She notices relief by holding her breast up manually.  Her shoulder straps cause grooves and pain and pressure that requires padding for relief. Pain medication is sometimes required with motrin and tylenol.  Activities that are hindered by enlarged breasts include: exercise and running.  She has tried supportive clothing as well as fitted bras without improvement.   Her breasts are extremely large and fairly symmetric.  She has hyperpigmentation of the inframammary area on both sides.  The sternal to nipple distance on the right is 33 cm and the left is 34 cm.  The IMF distance is 16 cm.  She is 5 feet 3 inches tall and weighs 164 pounds.  The BMI = 29.1 kg/m.  Preoperative bra size = 32G cup.  The estimated excess breast tissue to be removed at the time of surgery = 400-425 grams on the left and 400-425 grams on the right.  Mammogram history: 2 weeks ago because she thought she might have felt a lump on the right  lateral breast.  Mammogram was negative.  Family history of breast cancer: None.  Tobacco use: Marijuana but not tobacco.   The patient expresses the desire to pursue surgical intervention.  Review of Systems General: Positive for back pain, neck pain  Physical Exam    06/15/2022    3:09 PM 03/20/2022   10:30 AM 02/09/2022    9:14 AM  Vitals with BMI  Height 5\' 4"  5\' 4"  5\' 3"   Weight 158 lbs 6 oz 170 lbs 164 lbs 13 oz  BMI 27.18 123456 [id number]  Systolic [id number] [id number] [id number]  Diastolic 70 71 78  Pulse 74 68 65    General:  No acute distress,  Alert and oriented, Non-Toxic, Normal speech and affect Speaking in full sentences no acute distress  Photos taken at her last visit  Assessment/Plan This is a 27 year old female seen in our office for follow-up evaluation after undergoing 7 weeks of physical therapy for ongoing neck and back pain from mammary hyperplasia.  She notes no meaningful change in her baseline symptoms from the physical therapy.  She continues to have all of the previous symptoms and would like to proceed with bilateral breast reduction.  We will submit to insurance and reach out to her once we hear back from them.  [name] 06/15/2022, 4:30 PM

## 2022-06-18 ENCOUNTER — Encounter (HOSPITAL_BASED_OUTPATIENT_CLINIC_OR_DEPARTMENT_OTHER): Payer: BC Managed Care – PPO

## 2022-06-25 ENCOUNTER — Encounter (HOSPITAL_BASED_OUTPATIENT_CLINIC_OR_DEPARTMENT_OTHER): Payer: BC Managed Care – PPO

## 2022-06-25 DIAGNOSIS — R141 Gas pain: Secondary | ICD-10-CM | POA: Insufficient documentation

## 2022-06-25 DIAGNOSIS — R635 Abnormal weight gain: Secondary | ICD-10-CM | POA: Insufficient documentation

## 2022-06-25 DIAGNOSIS — R140 Abdominal distension (gaseous): Secondary | ICD-10-CM | POA: Insufficient documentation

## 2022-06-27 ENCOUNTER — Telehealth: Payer: Self-pay | Admitting: Plastic Surgery

## 2022-06-27 NOTE — Telephone Encounter (Signed)
Pending Ref# [id number] for surgical procedure BIL Br Reduction.

## 2022-07-02 ENCOUNTER — Encounter (HOSPITAL_BASED_OUTPATIENT_CLINIC_OR_DEPARTMENT_OTHER): Payer: BC Managed Care – PPO

## 2022-07-09 ENCOUNTER — Encounter (HOSPITAL_BASED_OUTPATIENT_CLINIC_OR_DEPARTMENT_OTHER): Payer: BC Managed Care – PPO

## 2022-07-13 ENCOUNTER — Telehealth: Payer: Self-pay | Admitting: Plastic Surgery

## 2022-07-13 NOTE — Telephone Encounter (Signed)
Spoke with pt to notify of insurance decision and reason why.  Will question provider at pt request to see if they can take more off.  She will do whatever she would like for her to do to proceed with approval from insurance.

## 2022-07-16 ENCOUNTER — Encounter (HOSPITAL_BASED_OUTPATIENT_CLINIC_OR_DEPARTMENT_OTHER): Payer: BC Managed Care – PPO

## 2022-07-23 ENCOUNTER — Encounter (HOSPITAL_BASED_OUTPATIENT_CLINIC_OR_DEPARTMENT_OTHER): Payer: BC Managed Care – PPO

## 2022-07-30 ENCOUNTER — Encounter (HOSPITAL_BASED_OUTPATIENT_CLINIC_OR_DEPARTMENT_OTHER): Payer: BC Managed Care – PPO

## 2022-08-06 ENCOUNTER — Encounter (HOSPITAL_BASED_OUTPATIENT_CLINIC_OR_DEPARTMENT_OTHER): Payer: BC Managed Care – PPO | Admitting: Physical Therapy

## 2022-08-13 ENCOUNTER — Encounter (HOSPITAL_BASED_OUTPATIENT_CLINIC_OR_DEPARTMENT_OTHER): Payer: BC Managed Care – PPO | Admitting: Physical Therapy

## 2022-08-15 ENCOUNTER — Encounter: Payer: Self-pay | Admitting: Plastic Surgery

## 2022-08-15 ENCOUNTER — Telehealth (INDEPENDENT_AMBULATORY_CARE_PROVIDER_SITE_OTHER): Payer: BC Managed Care – PPO | Admitting: Plastic Surgery

## 2022-08-15 DIAGNOSIS — N62 Hypertrophy of breast: Secondary | ICD-10-CM

## 2022-08-15 DIAGNOSIS — G8929 Other chronic pain: Secondary | ICD-10-CM

## 2022-08-15 DIAGNOSIS — R21 Rash and other nonspecific skin eruption: Secondary | ICD-10-CM | POA: Diagnosis not present

## 2022-08-15 DIAGNOSIS — M542 Cervicalgia: Secondary | ICD-10-CM | POA: Diagnosis not present

## 2022-08-15 DIAGNOSIS — Z6827 Body mass index (BMI) 27.0-27.9, adult: Secondary | ICD-10-CM

## 2022-08-15 DIAGNOSIS — M549 Dorsalgia, unspecified: Secondary | ICD-10-CM | POA: Diagnosis not present

## 2022-08-15 NOTE — Progress Notes (Signed)
   Subjective:    Patient ID: Donna Galloway, female    DOB: [birth year], 27 y.o.   MRN: [id number]  HPI The patient is a 27 year old female joining me by phone for discussion about her breast reduction.  She has very large breasts and complains of neck and back pain.  She gets strap indentations on her shoulders.  She tries to do different techniques to relieve the pressure on her shoulders with padding but that does not really seem to work.  She gets rashes and skin breakdown in her folds.  Her breasts breasts are fairly symmetric.  She does have hyperpigmentation at the inframammary folds.  She is 5 feet 4 inches tall and weighs 158 pounds. The BMI 27.1 kg/m2. The estimated amount of tissue to be removed is 500 g.   Review of Systems  Constitutional: Negative.   HENT: Negative.    Eyes: Negative.   Respiratory: Negative.  Negative for chest tightness and shortness of breath.   Cardiovascular: Negative.   Gastrointestinal: Negative.   Endocrine: Negative.   Genitourinary: Negative.   Musculoskeletal:  Positive for back pain and neck pain.  Skin:  Positive for rash.       Objective:   Physical Exam      Assessment & Plan:     ICD-10-CM   1. Symptomatic mammary hypertrophy  N62     2. Chronic bilateral thoracic back pain  M54.6    G89.29     3. Neck pain  M54.2       I connected with  Shayne Alken on 08/15/22 by phone and verified that I am speaking with the correct person using two identifiers.  The patient and I are both in [place] and I was at the office.  We spent 5 minutes in discussion.   I discussed the limitations of evaluation and management by telemedicine. The patient expressed understanding and agreed to proceed.

## 2022-08-29 NOTE — Telephone Encounter (Signed)
Pt called today at 4:14pm to check on the status of the appeal to her insurance.  Please call her at [phone].

## 2022-08-31 ENCOUNTER — Telehealth: Payer: Self-pay | Admitting: Plastic Surgery

## 2022-08-31 NOTE — Telephone Encounter (Signed)
Notified that extra progress note was sent to insurance for appeal ref# [id number] to [phone] per [facility].

## 2022-09-18 ENCOUNTER — Telehealth: Payer: Self-pay

## 2022-09-18 NOTE — Telephone Encounter (Signed)
Patient called in to follow up on the appeal that was done on 08/31/2022 but I do not see any additional notes regarding [employer] and appeals.  Please follow up with patient @ [phone].

## 2022-09-20 NOTE — Telephone Encounter (Signed)
Returned patients call. Advised her I will check on her Appeal and call her back.

## 2022-09-24 ENCOUNTER — Telehealth: Payer: Self-pay | Admitting: *Deleted

## 2022-09-24 ENCOUNTER — Telehealth: Payer: Self-pay | Admitting: Plastic Surgery

## 2022-09-24 NOTE — Telephone Encounter (Signed)
Pt called and insurance denied Sx 2 times and she was wondering what the next steps are.  Please call her at [phone]

## 2022-09-24 NOTE — Telephone Encounter (Signed)
Addendum to previous note: patient received a call from insurance that appeal was denied. She has not received anything in writing as of the time of the call.

## 2022-09-24 NOTE — Telephone Encounter (Signed)
Received call from patient on 09/21/22 stating that she had received a denial on the appeal. Advised we had not yet received any denial and that I would be in touch once we had the letter.

## 2022-10-02 ENCOUNTER — Telehealth: Payer: Self-pay | Admitting: *Deleted

## 2022-10-02 NOTE — Telephone Encounter (Signed)
Please route any surgery scheduling calls from this patient directly to [facility].

## 2022-10-15 ENCOUNTER — Telehealth: Payer: Self-pay | Admitting: Plastic Surgery

## 2022-10-15 NOTE — Telephone Encounter (Signed)
Patient called and is putting together a written appeal to get insurance to approve surgery.  She has some questions and wants to speak with Dr. Ulice Bold.  Please call her at [phone]

## 2022-10-16 ENCOUNTER — Telehealth: Payer: Self-pay | Admitting: *Deleted

## 2022-10-16 NOTE — Telephone Encounter (Signed)
Replied to patient that we have yet to receive appeal denial letter though calls have been made to [facility] [place] with excessive hold times and requests for the appeals denial be sent. We have not been provided with additional appeals options at this time. Will follow up with Dr. Ulice Bold and practice administrator about this patient.

## 2022-10-16 NOTE — Telephone Encounter (Signed)
[employer] [place] Contacted and denial letter requested from provider services 09/24/22 and 10/02/22.  At this date, no letters have been received via fax as requested.

## 2022-10-17 ENCOUNTER — Telehealth: Payer: Self-pay | Admitting: *Deleted

## 2022-10-17 NOTE — Telephone Encounter (Signed)
Notified patient via Mychart that there is no longer any plan to proceed with surgery. Her insurance has denied her case twice. We will not be submitting any additional appeals nor moving forward with any surgery under a cosmetic basis.

## 2022-11-14 ENCOUNTER — Other Ambulatory Visit: Payer: Self-pay | Admitting: Family

## 2022-11-14 DIAGNOSIS — Z30011 Encounter for initial prescription of contraceptive pills: Secondary | ICD-10-CM

## 2022-11-30 ENCOUNTER — Ambulatory Visit (INDEPENDENT_AMBULATORY_CARE_PROVIDER_SITE_OTHER): Payer: BC Managed Care – PPO | Admitting: Family

## 2022-11-30 VITALS — BP 103/72 | HR 84 | Temp 97.8°F | Ht 64.0 in | Wt 173.1 lb

## 2022-11-30 DIAGNOSIS — G43109 Migraine with aura, not intractable, without status migrainosus: Secondary | ICD-10-CM

## 2022-11-30 DIAGNOSIS — Z3041 Encounter for surveillance of contraceptive pills: Secondary | ICD-10-CM

## 2022-11-30 DIAGNOSIS — Z30011 Encounter for initial prescription of contraceptive pills: Secondary | ICD-10-CM

## 2022-11-30 MED ORDER — NORETHINDRONE 0.35 MG PO TABS: 1.0000 | ORAL_TABLET | Freq: Every day | ORAL | 3 refills | Status: DC

## 2022-11-30 MED ORDER — NAPROXEN 500 MG PO TABS: 500.0000 mg | ORAL_TABLET | Freq: Two times a day (BID) | ORAL | 2 refills | Status: DC

## 2022-11-30 MED ORDER — CYCLOBENZAPRINE HCL 5 MG PO TABS: 5.0000 mg | ORAL_TABLET | Freq: Three times a day (TID) | ORAL | 2 refills | Status: DC | PRN

## 2022-11-30 NOTE — Progress Notes (Unsigned)
   Patient ID: Donna Galloway, female    DOB: 1996-01-17, 27 y.o.   MRN: [id number]  Chief Complaint  Patient presents with  . Weight Loss    Pt would like to discuss weight loss Treatments.   . Migraine    Pt c/o Migraines, present off and on for 2 weeks. Has tried tylenol which did helped slightly.   . Contraception    Medication refill    HPI: Weight gain:  pt lost 12lbs after seeing plastic surgeon for breast reduction, insurance denied coverage anyway stating to do more PT. Reports eating less calories,   Migraine:  pt reports having migraines as a child; and had been doing well up until college, and then did well since then until this one started, reports having again over the last 2 weeks. Reports pain on left side of head, sensitive to light, with nausea, she took Tylenol which helped a little. States Excedrin migraine does not work for her.  She says the pain lasts 1-2d then better, then back again. She reports having an aura with bright lights and tingling in her hands, and her thoughts are fuzzy. Reports her triggers can be lack of food   Assessment & Plan:  Encounter for initial prescription of contraceptive pills    Subjective:    Outpatient Medications Prior to Visit  Medication Sig Dispense Refill  . Acetaminophen-Caff-Pyrilamine (MIDOL COMPLETE PO) Take 1,000 mg by mouth as needed (menstrual cycles).    Marland Kitchen ibuprofen (ADVIL) 200 MG tablet Take 600-800 mg by mouth every 6 (six) hours as needed for moderate pain.    . Multiple Vitamins-Minerals (HAIR SKIN AND NAILS FORMULA) TABS Take 1 tablet by mouth daily in the afternoon.    . norethindrone (MICRONOR) 0.35 MG tablet TAKE 1 TABLET(0.35 MG) BY MOUTH DAILY 28 tablet 11   No facility-administered medications prior to visit.   Past Medical History:  Diagnosis Date  . Anxiety   . Depression    No past surgical history on file. Allergies  Allergen Reactions  . Acetic Acid   . Citrus   . Penicillins Rash       Objective:    Physical Exam Vitals and nursing note reviewed.  Constitutional:      Appearance: Normal appearance.  Cardiovascular:     Rate and Rhythm: Normal rate and regular rhythm.  Pulmonary:     Effort: Pulmonary effort is normal.     Breath sounds: Normal breath sounds.  Musculoskeletal:        General: Normal range of motion.  Skin:    General: Skin is warm and dry.  Neurological:     Mental Status: She is alert.  Psychiatric:        Mood and Affect: Mood normal.        Behavior: Behavior normal.   BP 103/72 (BP Location: Left Arm, Patient Position: Sitting, Cuff Size: Normal)   Pulse 84   Temp 97.8 F (36.6 C) (Temporal)   Ht 5\' 4"  (1.626 m)   Wt 173 lb 2 oz (78.5 kg)   LMP 11/20/2022 (Approximate) Comment: PILL  SpO2 96%   BMI 29.72 kg/m  Wt Readings from Last 3 Encounters:  11/30/22 173 lb 2 oz (78.5 kg)  06/15/22 158 lb 6.4 oz (71.8 kg)  03/20/22 170 lb (77.1 kg)      Dulce Sellar, NP

## 2022-12-02 NOTE — Assessment & Plan Note (Signed)
chronic, intermittent pt reports feeling better today sending Flexeril 5mg  and Naproxen 500mg , advised on use & SE in case pt has another episode advised on avoiding triggers as much as possible f/u 6 mos or prn

## 2023-01-24 ENCOUNTER — Encounter: Payer: Self-pay | Admitting: Family

## 2023-01-24 DIAGNOSIS — R609 Edema, unspecified: Secondary | ICD-10-CM

## 2023-01-25 ENCOUNTER — Other Ambulatory Visit: Payer: Self-pay | Admitting: Family

## 2023-01-25 DIAGNOSIS — R609 Edema, unspecified: Secondary | ICD-10-CM

## 2023-01-25 MED ORDER — HYDROCHLOROTHIAZIDE 12.5 MG PO TABS: 12.5000 mg | ORAL_TABLET | Freq: Every morning | ORAL | 0 refills | Status: DC

## 2023-02-12 ENCOUNTER — Other Ambulatory Visit: Payer: Self-pay | Admitting: Family

## 2023-02-12 DIAGNOSIS — G43109 Migraine with aura, not intractable, without status migrainosus: Secondary | ICD-10-CM

## 2023-02-15 ENCOUNTER — Ambulatory Visit (INDEPENDENT_AMBULATORY_CARE_PROVIDER_SITE_OTHER): Payer: BC Managed Care – PPO | Admitting: Family

## 2023-02-15 ENCOUNTER — Encounter: Payer: Self-pay | Admitting: Family

## 2023-02-15 VITALS — BP 114/79 | HR 81 | Temp 98.0°F | Ht 64.0 in | Wt 180.4 lb

## 2023-02-15 DIAGNOSIS — E669 Obesity, unspecified: Secondary | ICD-10-CM | POA: Diagnosis not present

## 2023-02-15 DIAGNOSIS — Z3009 Encounter for other general counseling and advice on contraception: Secondary | ICD-10-CM

## 2023-02-15 DIAGNOSIS — Z833 Family history of diabetes mellitus: Secondary | ICD-10-CM | POA: Diagnosis not present

## 2023-02-15 LAB — POCT GLYCOSYLATED HEMOGLOBIN (HGB A1C): Hemoglobin A1C: 4.9 % (ref 4.0–5.6)

## 2023-02-15 NOTE — Progress Notes (Unsigned)
Patient ID: Donna Galloway, female    DOB: Aug 02, 1995, 27 y.o.   MRN: [id number]  Chief Complaint  Patient presents with   Weight Gain    Pt c/o weight gain, Pt has tried diet change and exercise   Contraception    Pt would like to discuss IUD, Pt is currently on Micronor.        Discussed the use of AI scribe software for clinical note transcription with the patient, who gave verbal consent to proceed.  History of Present Illness   The patient, on Micronor for contraception, presents with concerns about recent weight gain and increased frequency of headaches. She reports consistent use of the contraceptive and adherence to a diet and exercise regimen, but has not observed any weight loss. The patient expresses frustration and confusion about the weight gain, suspecting it may be related to hormonal changes or metabolic issues. She has been tracking her caloric intake and reports consuming around 1200-1300 calories per day. The patient also mentions joint pain, which she attributes to the increased weight. She has a history of breast issues and had previously sought breast reduction surgery, but was denied by her insurance. The patient is considering switching to a long-term contraceptive method, such as an IUD or Nexplanon, and is also interested in exploring weight loss medications and programs. She expresses a desire to undergo further testing to better understand the cause of her weight gain and to rule out conditions such as PCOS or endometriosis.     Assessment & Plan:     Contraception - Currently on Micronor with concerns about weight gain and headaches. Interested in long-term options. -Referring to GYN for IUD or Nexplanon placement. -Continue Micronor until IUD or Nexplanon is placed.  Weight Gain - Concerns about recent weight gain despite diet and exercise efforts. No known diabetes or prediabetes. POCT A1C today 4.9.  -Refer to weight loss clinic for further evaluation and  management. -Wt. Loss strategies reviewed including portion control, less carbs including sweets, eating most of calories earlier in day, drinking 64oz water qd, and establishing daily exercise routine.   Subjective:    Outpatient Medications Prior to Visit  Medication Sig Dispense Refill   Acetaminophen-Caff-Pyrilamine (MIDOL COMPLETE PO) Take 1,000 mg by mouth as needed (menstrual cycles).     cyclobenzaprine (FLEXERIL) 5 MG tablet Take 1-2 tablets (5-10 mg total) by mouth 3 (three) times daily as needed for muscle spasms (Migraine). Take at first sign of migraine with Sumatriptan 60 tablet 2   hydrochlorothiazide (HYDRODIURIL) 12.5 MG tablet Take 1 tablet (12.5 mg total) by mouth in the morning. 30 tablet 0   ibuprofen (ADVIL) 200 MG tablet Take 600-800 mg by mouth every 6 (six) hours as needed for moderate pain.     Multiple Vitamins-Minerals (HAIR SKIN AND NAILS FORMULA) TABS Take 1 tablet by mouth daily in the afternoon.     naproxen (NAPROSYN) 500 MG tablet TAKE 1 TABLET BY MOUTH TWICE DAILY WITH A MEAL. TAKE WITH FLEXERIL AT FIRST SIGN OF HEADACHE 30 tablet 2   norethindrone (MICRONOR) 0.35 MG tablet Take 1 tablet (0.35 mg total) by mouth daily. 84 tablet 3   No facility-administered medications prior to visit.   Past Medical History:  Diagnosis Date   Anxiety    Depression    No past surgical history on file. Allergies  Allergen Reactions   Acetic Acid    Citrus    Penicillins Rash      Objective:  Physical Exam Vitals and nursing note reviewed.  Constitutional:      Appearance: Normal appearance. She is obese.  Cardiovascular:     Rate and Rhythm: Normal rate and regular rhythm.  Pulmonary:     Effort: Pulmonary effort is normal.     Breath sounds: Normal breath sounds.  Musculoskeletal:        General: Normal range of motion.  Skin:    General: Skin is warm and dry.  Neurological:     Mental Status: She is alert.  Psychiatric:        Mood and Affect: Mood  normal.        Behavior: Behavior normal.    There were no vitals taken for this visit. Wt Readings from Last 3 Encounters:  11/30/22 173 lb 2 oz (78.5 kg)  06/15/22 158 lb 6.4 oz (71.8 kg)  03/20/22 170 lb (77.1 kg)       Dulce Sellar, NP

## 2023-02-17 DIAGNOSIS — E669 Obesity, unspecified: Secondary | ICD-10-CM | POA: Insufficient documentation

## 2023-02-17 NOTE — Assessment & Plan Note (Signed)
Concerns about recent weight gain despite diet and exercise efforts. No known diabetes or prediabetes. POCT A1C today 4.9.  -Refer to weight loss clinic for further evaluation and management. -Wt. Loss strategies reviewed including portion control, less carbs including sweets, eating most of calories earlier in day, drinking 64oz water qd, and establishing daily exercise routine.

## 2023-02-18 ENCOUNTER — Ambulatory Visit: Payer: BC Managed Care – PPO | Admitting: Family

## 2023-02-18 NOTE — Addendum Note (Signed)
Addended byDulce Sellar on: 02/18/2023 08:18 AM   Modules accepted: Orders

## 2023-02-21 ENCOUNTER — Other Ambulatory Visit: Payer: Self-pay | Admitting: Family

## 2023-02-21 DIAGNOSIS — R609 Edema, unspecified: Secondary | ICD-10-CM

## 2023-02-25 DIAGNOSIS — R1032 Left lower quadrant pain: Secondary | ICD-10-CM | POA: Diagnosis not present

## 2023-02-25 DIAGNOSIS — R112 Nausea with vomiting, unspecified: Secondary | ICD-10-CM | POA: Diagnosis not present

## 2023-02-25 DIAGNOSIS — R1031 Right lower quadrant pain: Secondary | ICD-10-CM | POA: Diagnosis not present

## 2023-02-27 ENCOUNTER — Ambulatory Visit (INDEPENDENT_AMBULATORY_CARE_PROVIDER_SITE_OTHER): Payer: BC Managed Care – PPO | Admitting: Family

## 2023-02-27 ENCOUNTER — Encounter: Payer: Self-pay | Admitting: Family

## 2023-02-27 VITALS — BP 110/75 | HR 86 | Temp 98.0°F | Ht 64.0 in | Wt 185.6 lb

## 2023-02-27 DIAGNOSIS — K5901 Slow transit constipation: Secondary | ICD-10-CM | POA: Diagnosis not present

## 2023-02-27 DIAGNOSIS — R140 Abdominal distension (gaseous): Secondary | ICD-10-CM

## 2023-02-27 NOTE — Progress Notes (Signed)
Patient ID: Donna Galloway, female    DOB: 07-01-1995, 27 y.o.   MRN: [id number]  Chief Complaint  Patient presents with   Follow-up    Urgent care follow up for severe stomach cramps along with vomiting. Patient states she was impacted and had protein in her urine sample.   Discussed the use of AI scribe software for clinical note transcription with the patient, who gave verbal consent to proceed.  History of Present Illness   The patient, with no significant past medical history, presents with severe abdominal pain and constipation. The pain is described as intense cramping, similar to menstrual cramps, originating in the lower back and intensifying as it rises. The pain is so severe that it has woken her up from sleep and caused nausea to the point of vomiting. She has also experienced a sensation of pressure and bloating, as well as tingling in the legs. She has been to an urgent care center where she was told she was impacted and given an X-ray. She has been prescribed antibiotics and Zofran for nausea but has not picked up the antibiotics as she is unsure if she needs them. She has tried over-the-counter remedies such as Dulcolax and Gas-X with limited relief.        Assessment & Plan:     Constipation - Severe abdominal pain and bloating, with a recent x-ray showing impaction. Patient has tried Dulcolax and Gas-X with partial relief. No bowel movement today despite laxative use. -Recommend over-the-counter magnesium citrate liquid to stimulate bowel movement. Start with half a bottle day 1 and increase to full bottle day 2 if no large stool. -Ok to continue Gas-X as directed for next few days. -Once bowel movement is regular, recommend daily use of a stool softener, generic Miralax , or oral Magnesium oxide, glycinate, or taurate to maintain regularity. -Ok to take Zofran prn for nausea. Do not take antibiotics prescribed by UC. -Increase water intake to 2L every day, eat fiber rich  foods. -F/U prn  Weight Management - Patient has noted recent weight gain and has been referred to a [facility]. -Follow up with the clinic using provided phone number to schedule an appointment.     Subjective:    Outpatient Medications Prior to Visit  Medication Sig Dispense Refill   Acetaminophen-Caff-Pyrilamine (MIDOL COMPLETE PO) Take 1,000 mg by mouth as needed (menstrual cycles).     cyclobenzaprine (FLEXERIL) 5 MG tablet Take 1-2 tablets (5-10 mg total) by mouth 3 (three) times daily as needed for muscle spasms (Migraine). Take at first sign of migraine with Sumatriptan 60 tablet 2   hydrochlorothiazide (HYDRODIURIL) 12.5 MG tablet TAKE 1 TABLET(12.5 MG) BY MOUTH IN THE MORNING 90 tablet 0   ibuprofen (ADVIL) 200 MG tablet Take 600-800 mg by mouth every 6 (six) hours as needed for moderate pain.     Multiple Vitamins-Minerals (HAIR SKIN AND NAILS FORMULA) TABS Take 1 tablet by mouth daily in the afternoon.     naproxen (NAPROSYN) 500 MG tablet TAKE 1 TABLET BY MOUTH TWICE DAILY WITH A MEAL. TAKE WITH FLEXERIL AT FIRST SIGN OF HEADACHE 30 tablet 2   norethindrone (MICRONOR) 0.35 MG tablet Take 1 tablet (0.35 mg total) by mouth daily. 84 tablet 3   No facility-administered medications prior to visit.   Past Medical History:  Diagnosis Date   Anxiety    Depression    History reviewed. No pertinent surgical history. Allergies  Allergen Reactions   Acetic Acid  Citrus    Penicillins Rash      Objective:    Physical Exam Vitals and nursing note reviewed.  Constitutional:      Appearance: Normal appearance.  Cardiovascular:     Rate and Rhythm: Normal rate and regular rhythm.  Pulmonary:     Effort: Pulmonary effort is normal.     Breath sounds: Normal breath sounds.  Abdominal:     General: There is distension.     Palpations: Abdomen is soft.     Tenderness: There is generalized abdominal tenderness.  Musculoskeletal:        General: Normal  range of motion.  Skin:    General: Skin is warm and dry.  Neurological:     Mental Status: She is alert.  Psychiatric:        Mood and Affect: Mood normal.        Behavior: Behavior normal.    BP 110/75   Pulse 86   Temp 98 F (36.7 C) (Temporal)   Ht 5\' 4"  (1.626 m)   Wt 185 lb 9.6 oz (84.2 kg)   LMP 01/30/2023 (Approximate)   SpO2 100%   BMI 31.86 kg/m  Wt Readings from Last 3 Encounters:  02/27/23 185 lb 9.6 oz (84.2 kg)  02/15/23 180 lb 6 oz (81.8 kg)  11/30/22 173 lb 2 oz (78.5 kg)      Dulce Sellar, NP

## 2023-02-27 NOTE — Patient Instructions (Addendum)
It was very nice to see you today!   Ok to take the Zofran as needed for nausea.  Look for Magnesium Citrate liquid - ask pharmacist where it is on the shelf.  Drink 1/4-1/2 of bottle, if no results, then drink the rest the next day. Let me know if they don't have in stock. Continue to take the Gas-X as needed for bloating pain.  In a few days after completely evacuating your bowels, start taking over the counter Magnesium oxide, glycinate, L-threonate, or taurate for regular daily bowel function, it can also help with anxiety, maintaining sleep (if taken at night), muscle recovery, hot flashes, and maintaining blood pressure.  Look for chelated form (better absorbability) and for any over the counter supplement or vitamin, look for organic or has a 3rd party seal from NSF international, [employer] or USP.  This authenticates the quality but not the efficacy (since not FDA approved).      PLEASE NOTE:  If you had any lab tests please let us know if you have not heard back within a few days. You may see your results on MyChart before we have a chance to review them but we will give you a call once they are reviewed by [place]. If we ordered any referrals today, please let us know if you have not heard from their office within the next week.

## 2023-03-15 ENCOUNTER — Ambulatory Visit: Payer: BC Managed Care – PPO | Admitting: Family

## 2023-05-20 ENCOUNTER — Ambulatory Visit: Payer: BC Managed Care – PPO | Admitting: Nurse Practitioner

## 2023-05-20 NOTE — Progress Notes (Deleted)
   Rozalia Dino 15-Sep-1995 [id number]   History:  28 y.o. G0 presents for annual exam. POPS for heavy, painful menses. Mother with history of fibroids. H/O migraines with aura.  Normal pap history.   Gynecologic History No LMP recorded.   Contraception/Family planning: oral progesterone-only contraceptive Sexually active: Yes  Health Maintenance Last Pap: 02/07/2022. Results were: Normal Last mammogram: 03/01/2022 (right diagnostic). Results were: Normal Last colonoscopy: Not indicated Last Dexa: Not indicated   Past medical history, past surgical history, family history and social history were all reviewed and documented in the EPIC chart. Works remote doing Producer, television/film/video, also works as Production assistant, radio.   ROS:  A ROS was performed and pertinent positives and negatives are included.  Exam:  There were no vitals filed for this visit.  There is no height or weight on file to calculate BMI.  General appearance:  Normal Thyroid:  Symmetrical, normal in size, without palpable masses or nodularity. Respiratory  Auscultation:  Clear without wheezing or rhonchi Cardiovascular  Auscultation:  Regular rate, without rubs, murmurs or gallops  Edema/varicosities:  Not grossly evident Abdominal  Soft,nontender, without masses, guarding or rebound.  Liver/spleen:  No organomegaly noted  Hernia:  None appreciated  Skin  Inspection:  Grossly normal Breasts: Examined lying and sitting.   Right: Tender, firm mass ~ 2 cm @ 8 o'clock. No retractions, nipple discharge or axillary adenopathy.  Left: Without masses, retractions, nipple discharge or axillary adenopathy. Pelvic: External genitalia:  no lesions              Urethra:  normal appearing urethra with no masses, tenderness or lesions              Bartholins and Skenes: normal                 Vagina: normal appearing vagina with normal color and discharge, no lesions              Cervix: no lesions Bimanual Exam:  Uterus:  no masses or  tenderness              Adnexa: no mass, fullness, tenderness              Rectovaginal: Deferred              Anus:  normal, no lesions  Patient informed chaperone available to be present for breast and pelvic exam. Patient has requested no chaperone to be present. Patient has been advised what will be completed during breast and pelvic exam.   Assessment/Plan:  28 y.o. G0 to establish care.   Well female exam with routine gynecological exam - Education provided on SBEs, importance of preventative screenings, current guidelines, high calcium diet, regular exercise, and multivitamin daily.  Labs with PCP.   Screening for cervical cancer - Normal Pap history.  Will repeat at 3-year interval per guidelines.   No follow-ups on file.    Olivia Mackie DNP, 7:45 AM 05/20/2023

## 2023-05-29 ENCOUNTER — Encounter (INDEPENDENT_AMBULATORY_CARE_PROVIDER_SITE_OTHER): Payer: Self-pay

## 2023-06-07 ENCOUNTER — Encounter: Payer: Self-pay | Admitting: Family

## 2023-06-07 NOTE — Telephone Encounter (Signed)
 LVM to call back asap to overbook as a virtual

## 2023-06-07 NOTE — Telephone Encounter (Signed)
 call and see if she can do a virtual today

## 2023-07-15 ENCOUNTER — Ambulatory Visit: Admitting: Orthopedic Surgery

## 2023-08-10 ENCOUNTER — Other Ambulatory Visit: Payer: Self-pay

## 2023-08-10 ENCOUNTER — Emergency Department (HOSPITAL_COMMUNITY)
Admission: EM | Admit: 2023-08-10 | Discharge: 2023-08-10 | Disposition: A | Discharge status: Home / Self Care | Attending: Emergency Medicine | Admitting: Emergency Medicine

## 2023-08-10 ENCOUNTER — Encounter (HOSPITAL_COMMUNITY): Payer: Self-pay | Admitting: Emergency Medicine

## 2023-08-10 DIAGNOSIS — Z3201 Encounter for pregnancy test, result positive: Secondary | ICD-10-CM | POA: Insufficient documentation

## 2023-08-10 DIAGNOSIS — Z3A01 Less than 8 weeks gestation of pregnancy: Secondary | ICD-10-CM | POA: Diagnosis not present

## 2023-08-10 LAB — HCG, QUANTITATIVE, PREGNANCY: hCG, Beta Chain, Quant, S: 884 m[IU]/mL — ABNORMAL HIGH (ref <5)

## 2023-08-10 NOTE — ED Triage Notes (Signed)
 Patient report her pregnancy is positive tonight. Patient report she want to know how far she is.

## 2023-08-10 NOTE — ED Provider Notes (Signed)
 [place] EMERGENCY DEPARTMENT AT [facility] Provider Note   CSN: [id number] Arrival date & time: 08/10/23  0053     History  Chief Complaint  Patient presents with   Possible Pregnancy    Donna Galloway is a 28 y.o. female.  Patient presents to the emergency department requesting pregnancy test.  She reports a positive home pregnancy test earlier this evening.  She states that her last menstrual cycle was 1 week ago.  She denies any other complaints at this time.   Possible Pregnancy       Home Medications Prior to Admission medications   Medication Sig Start Date End Date Taking? Authorizing Provider  Acetaminophen -Caff-Pyrilamine (MIDOL COMPLETE PO) Take 1,000 mg by mouth as needed (menstrual cycles).    [provider]  cyclobenzaprine  (FLEXERIL ) 5 MG tablet Take 1-2 tablets (5-10 mg total) by mouth 3 (three) times daily as needed for muscle spasms (Migraine). Take at first sign of migraine with Sumatriptan 11/30/22   Versa Gore, NP  hydrochlorothiazide  (HYDRODIURIL ) 12.5 MG tablet TAKE 1 TABLET(12.5 MG) BY MOUTH IN THE MORNING 02/25/23   Versa Gore, NP  ibuprofen (ADVIL) 200 MG tablet Take 600-800 mg by mouth every 6 (six) hours as needed for moderate pain.    [provider]  Multiple Vitamins-Minerals (HAIR SKIN AND NAILS FORMULA) TABS Take 1 tablet by mouth daily in the afternoon.    [provider]  naproxen  (NAPROSYN ) 500 MG tablet TAKE 1 TABLET BY MOUTH TWICE DAILY WITH A MEAL. TAKE WITH FLEXERIL  AT FIRST SIGN OF HEADACHE 02/12/23   Versa Gore, NP  norethindrone  (MICRONOR ) 0.35 MG tablet Take 1 tablet (0.35 mg total) by mouth daily. 11/30/22   Versa Gore, NP  ondansetron  (ZOFRAN -ODT) 4 MG disintegrating tablet Take by mouth. 02/25/23   [provider]      Allergies    Acetic acid, Citrus, and Penicillins    Review of Systems   Review of Systems  Physical Exam Updated Vital Signs BP  139/87   Pulse 90   Temp 98 F (36.7 C)   Resp 16   Ht 5\' 4"  (1.626 m)   Wt 84.2 kg   SpO2 100%   BMI 31.86 kg/m  Physical Exam Vitals and nursing note reviewed.  HENT:     Head: Normocephalic and atraumatic.  Eyes:     Conjunctiva/sclera: Conjunctivae normal.  Pulmonary:     Effort: Pulmonary effort is normal. No respiratory distress.  Musculoskeletal:        General: No signs of injury.     Cervical back: Normal range of motion.  Skin:    General: Skin is dry.  Neurological:     Mental Status: She is alert.  Psychiatric:        Speech: Speech normal.        Behavior: Behavior normal.     ED Results / Procedures / Treatments   Labs (all labs ordered are listed, but only abnormal results are displayed) Labs Reviewed  HCG, QUANTITATIVE, PREGNANCY - Abnormal; Notable for the following components:      Result Value   hCG, Beta Chain, Quant, S 884 (*)    All other components within normal limits    EKG None  Radiology No results found.  Procedures Procedures    Medications Ordered in ED Medications - No data to display  ED Course/ Medical Decision Making/ A&P  Medical Decision Making Amount and/or Complexity of Data Reviewed Labs: ordered.   Patient presents to the emergency department requesting a pregnancy test  I ordered and interpreted labs including a beta hCG.  Quantitative hCG was 884.  Patient with positive pregnancy test showing likely very early pregnancy.  She has no complaints at this time such as abdominal pain or active bleeding.  No indication for emergent imaging.  Plan to recommend follow-up with OB/GYN for routine prenatal care and further assessment as needed.        Final Clinical Impression(s) / ED Diagnoses Final diagnoses:  Less than [redacted] weeks gestation of pregnancy    Rx / DC Orders ED Discharge Orders     None         Delories Fetter 08/10/23 0208    Lindle Rhea, MD 08/10/23 [phone]

## 2023-08-10 NOTE — Discharge Instructions (Signed)
 Your pregnancy test tonight was positive showing a possible early pregnancy.  Please follow-up with OB/GYN for further evaluation including repeat lab work and ultrasound as needed.  If you develop any life-threatening symptoms return to the emergency department.

## 2023-08-12 ENCOUNTER — Encounter: Payer: Self-pay | Admitting: Family

## 2023-08-12 ENCOUNTER — Ambulatory Visit (INDEPENDENT_AMBULATORY_CARE_PROVIDER_SITE_OTHER): Admitting: Family

## 2023-08-12 VITALS — BP 115/75 | HR 79 | Temp 97.0°F | Ht 64.0 in | Wt 187.6 lb

## 2023-08-12 DIAGNOSIS — Z3491 Encounter for supervision of normal pregnancy, unspecified, first trimester: Secondary | ICD-10-CM | POA: Diagnosis not present

## 2023-08-12 NOTE — Patient Instructions (Signed)
 It was very nice to see you today!   I will review your lab results via MyChart in a few days.   Have a great rest of the week!   PLEASE NOTE:  If you had any lab tests please let us know if you have not heard back within a few days. You may see your results on MyChart before we have a chance to review them but we will give you a call once they are reviewed by [place]. If we ordered any referrals today, please let us know if you have not heard from their office within the next week.

## 2023-08-12 NOTE — Progress Notes (Signed)
 Patient ID: Donna Galloway, female    DOB: 1995-05-13, 28 y.o.   MRN: [id number]  Chief Complaint  Patient presents with   Nausea    Pt had a cycle 1 week ago, lasted for a few days. Pt c/o nausea, Bloating, headache,constipation and fatigue, Present for a 3 weeks. Positive pregnancy on 5/17.  Discussed the use of AI scribe software for clinical note transcription with the patient, who gave verbal consent to proceed.  History of Present Illness Donna Galloway is a 28 year old female who presents with a positive pregnancy test and associated symptoms.  She experiences fatigue, bloating, breast tenderness and headaches. Her last menstrual cycle was one to two weeks ago, with less pain than usual but some bleeding. Despite increased water intake, symptoms persist. A home pregnancy test was positive within 30 seconds, and blood work confirmed the pregnancy. She uses a progesterone-only birth control pill (Micronor ) due to a family history of seizures and personal hx of migraine w/aura, and occasionally has taken it an hour off schedule. She has breast tenderness and daily headaches but no nausea. She is concerned about miscarriage due to initial bleeding and seeks further blood work to monitor her pregnancy status.  Assessment & Plan Pregnancy Confirmed pregnancy at 2-[redacted] weeks gestation in ED 3d ago. Differential includes possible miscarriage due to spotting. On Micronor  with possible inconsistent intake. Intends to continue pregnancy if confirmed. - Recheck HCG quant today to confirm increase vs decrease. - Refer to gynecology for evaluation and management. - Advise monitoring for further bleeding and seek medical attention if it occurs. - Instruct to discontinue Micronor  and hydrochlorothiazide . - Advise avoiding NSAIDs; recommend acetaminophen  for pain. - Provide information on dietary restrictions and alcohol avoidance. - Advise avoiding exposure to cat feces. - Discuss potential use of  Diclegis for nausea if needed. - Provide educational materials on pregnancy precautions and lifestyle modifications.   Subjective:     Outpatient Medications Prior to Visit  Medication Sig Dispense Refill   Acetaminophen -Caff-Pyrilamine (MIDOL COMPLETE PO) Take 1,000 mg by mouth as needed (menstrual cycles).     cyclobenzaprine  (FLEXERIL ) 5 MG tablet Take 1-2 tablets (5-10 mg total) by mouth 3 (three) times daily as needed for muscle spasms (Migraine). Take at first sign of migraine with Sumatriptan 60 tablet 2   hydrochlorothiazide  (HYDRODIURIL ) 12.5 MG tablet TAKE 1 TABLET(12.5 MG) BY MOUTH IN THE MORNING 90 tablet 0   ibuprofen (ADVIL) 200 MG tablet Take 600-800 mg by mouth every 6 (six) hours as needed for moderate pain.     Multiple Vitamins-Minerals (HAIR SKIN AND NAILS FORMULA) TABS Take 1 tablet by mouth daily in the afternoon.     naproxen  (NAPROSYN ) 500 MG tablet TAKE 1 TABLET BY MOUTH TWICE DAILY WITH A MEAL. TAKE WITH FLEXERIL  AT FIRST SIGN OF HEADACHE 30 tablet 2   ondansetron  (ZOFRAN -ODT) 4 MG disintegrating tablet Take by mouth.     norethindrone  (MICRONOR ) 0.35 MG tablet Take 1 tablet (0.35 mg total) by mouth daily. (Patient not taking: Reported on 08/12/2023) 84 tablet 3   No facility-administered medications prior to visit.   Past Medical History:  Diagnosis Date   Anxiety    Depression    No past surgical history on file. Allergies  Allergen Reactions   Acetic Acid    Citrus    Penicillins Rash      Objective:    Physical Exam Vitals and nursing note reviewed.  Constitutional:      Appearance: Normal appearance.  She is obese.  Cardiovascular:     Rate and Rhythm: Normal rate and regular rhythm.  Pulmonary:     Effort: Pulmonary effort is normal.     Breath sounds: Normal breath sounds.  Musculoskeletal:        General: Normal range of motion.  Skin:    General: Skin is warm and dry.  Neurological:     Mental Status: She is alert.  Psychiatric:         Mood and Affect: Mood normal.        Behavior: Behavior normal.    BP 115/75 (BP Location: Left Arm, Patient Position: Sitting, Cuff Size: Large)   Pulse 79   Temp (!) 97 F (36.1 C) (Temporal)   Ht 5\' 4"  (1.626 m)   Wt 187 lb 9.6 oz (85.1 kg)   LMP 07/30/2023 (Exact Date)   SpO2 99%   BMI 32.20 kg/m  Wt Readings from Last 3 Encounters:  08/12/23 187 lb 9.6 oz (85.1 kg)  08/10/23 185 lb 10 oz (84.2 kg)  02/27/23 185 lb 9.6 oz (84.2 kg)      Versa Gore, NP

## 2023-08-13 ENCOUNTER — Telehealth: Payer: Self-pay

## 2023-08-13 LAB — HCG, QUANTITATIVE, PREGNANCY: Quantitative HCG: 1107.71 m[IU]/mL

## 2023-08-13 NOTE — Telephone Encounter (Signed)
 Copied from CRM [id number]. Topic: Clinical - Lab/Test Results >> Aug 12, 2023  4:56 PM Baldo Levan wrote: Reason for CRM: Patient is calling in for test results that were done 05/19.   I returned pt's call and let her know lab results are not back yet. Advised pt once lab results are back and Versa Gore, NP has reviewed them, I will either give her a call or Trevor Fudge will send her a message through MyChart. Pt verbalized understanding.

## 2023-08-14 ENCOUNTER — Ambulatory Visit: Payer: Self-pay | Admitting: Family

## 2023-08-22 ENCOUNTER — Inpatient Hospital Stay (HOSPITAL_COMMUNITY)
Admission: AD | Admit: 2023-08-22 | Discharge: 2023-08-22 | Disposition: A | Discharge status: Home / Self Care | Attending: Obstetrics & Gynecology | Admitting: Obstetrics & Gynecology

## 2023-08-22 ENCOUNTER — Other Ambulatory Visit: Payer: Self-pay

## 2023-08-22 ENCOUNTER — Encounter (HOSPITAL_COMMUNITY): Payer: Self-pay | Admitting: *Deleted

## 2023-08-22 ENCOUNTER — Inpatient Hospital Stay (HOSPITAL_COMMUNITY)

## 2023-08-22 DIAGNOSIS — O3411 Maternal care for benign tumor of corpus uteri, first trimester: Secondary | ICD-10-CM | POA: Insufficient documentation

## 2023-08-22 DIAGNOSIS — N83291 Other ovarian cyst, right side: Secondary | ICD-10-CM | POA: Insufficient documentation

## 2023-08-22 DIAGNOSIS — O99611 Diseases of the digestive system complicating pregnancy, first trimester: Secondary | ICD-10-CM | POA: Insufficient documentation

## 2023-08-22 DIAGNOSIS — O3680X0 Pregnancy with inconclusive fetal viability, not applicable or unspecified: Secondary | ICD-10-CM | POA: Diagnosis not present

## 2023-08-22 DIAGNOSIS — K5900 Constipation, unspecified: Secondary | ICD-10-CM | POA: Insufficient documentation

## 2023-08-22 DIAGNOSIS — O200 Threatened abortion: Secondary | ICD-10-CM

## 2023-08-22 DIAGNOSIS — Z3A01 Less than 8 weeks gestation of pregnancy: Secondary | ICD-10-CM | POA: Diagnosis not present

## 2023-08-22 DIAGNOSIS — O3481 Maternal care for other abnormalities of pelvic organs, first trimester: Secondary | ICD-10-CM | POA: Diagnosis not present

## 2023-08-22 DIAGNOSIS — O209 Hemorrhage in early pregnancy, unspecified: Secondary | ICD-10-CM | POA: Insufficient documentation

## 2023-08-22 LAB — WET PREP, GENITAL
Clue Cells Wet Prep HPF POC: NONE SEEN
Sperm: NONE SEEN
Trich, Wet Prep: NONE SEEN
WBC, Wet Prep HPF POC: <10 (ref <10)
Yeast Wet Prep HPF POC: NONE SEEN

## 2023-08-22 LAB — URINALYSIS, ROUTINE W REFLEX MICROSCOPIC

## 2023-08-22 LAB — CBC
HCT: 38.7 % (ref 36.0–46.0)
Hemoglobin: 12.8 g/dL (ref 12.0–15.0)
MCH: 28.7 pg (ref 26.0–34.0)
MCHC: 33.1 g/dL (ref 30.0–36.0)
MCV: 86.8 fL (ref 80.0–100.0)
Platelets: 232 10*3/uL (ref 150–400)
RBC: 4.46 MIL/uL (ref 3.87–5.11)
RDW: 11.8 % (ref 11.5–15.5)
WBC: 8 10*3/uL (ref 4.0–10.5)
nRBC: 0 % (ref 0.0–0.2)

## 2023-08-22 LAB — ABO/RH: ABO/RH(D): O POS

## 2023-08-22 LAB — URINALYSIS, MICROSCOPIC (REFLEX)

## 2023-08-22 LAB — HCG, QUANTITATIVE, PREGNANCY: hCG, Beta Chain, Quant, S: 6064 m[IU]/mL — ABNORMAL HIGH (ref <5)

## 2023-08-22 MED ORDER — ACETAMINOPHEN 325 MG PO TABS
650.0000 mg | ORAL_TABLET | Freq: Once | ORAL | Status: AC
  Administered 2023-08-22: 650 mg via ORAL
  Filled 2023-08-22: qty 2

## 2023-08-22 NOTE — MAU Note (Signed)
 Donna Galloway is a 28 y.o. at [redacted]w[redacted]d here in MAU reporting: she thinks she's having a miscarriage because she's cramping and bleeding that began 30 minutes ago.  Reports VB is light and without clots and the abdominal pain is intermittent cramping.  LMP: 07/30/2023 Onset of complaint: today Pain score: 5 Vitals:   08/22/23 1709  BP: 127/82  Pulse: 89  Resp: 19  Temp: 98.1 F (36.7 C)  SpO2: 99%     FHT: NA  Lab orders placed from triage: UA

## 2023-08-22 NOTE — MAU Provider Note (Signed)
 History     CSN: [id number]  Arrival date and time: 08/22/23 1658   Event Date/Time   First Provider Initiated Contact with Patient 08/22/23 1736      Chief Complaint  Patient presents with   Abdominal Pain   Vaginal Bleeding    Donna Galloway is a 28 y.o. G1P0 at [redacted]w[redacted]d by Definite LMP of Jul 30, 2023.  She states she took a UPT because she was not feeling well. She states she confirmed at ED and followed up with her PCP. She presents today for abdominal cramping and bleeding that started 30 minutes.  She states the pain and bleeding has been gradually increasing and she noted clots the size of a quarter.  She states the cramping is located in lower abdominal area and radiates to back.  She reports issues with constipation, but no issues with urination or diarrhea. She states she is unsure of the last time she had a bowel movement. She endorses sexual activity in the past 3 days, but denies pain or discomfort.  She denies discharge prior to the bleeding. She rates the pain a 5/10.    OB History     Gravida  1   Para      Term      Preterm      AB      Living         SAB      IAB      Ectopic      Multiple      Live Births              Past Medical History:  Diagnosis Date   Anxiety    Depression     No past surgical history on file.  Family History  Problem Relation Age of Onset   Fibroids Mother    Heart disease Father     Social History   Tobacco Use   Smoking status: Never   Tobacco comments:    black & milds  Vaping Use   Vaping status: Never Used  Substance Use Topics   Alcohol use: Yes    Comment: occasionally wine   Drug use: Yes    Types: Marijuana    Allergies:  Allergies  Allergen Reactions   Acetic Acid    Citrus    Penicillins Rash    Medications Prior to Admission  Medication Sig Dispense Refill Last Dose/Taking   Acetaminophen -Caff-Pyrilamine (MIDOL COMPLETE PO) Take 1,000 mg by mouth as needed (menstrual cycles).       cyclobenzaprine  (FLEXERIL ) 5 MG tablet Take 1-2 tablets (5-10 mg total) by mouth 3 (three) times daily as needed for muscle spasms (Migraine). Take at first sign of migraine with Sumatriptan 60 tablet 2    hydrochlorothiazide  (HYDRODIURIL ) 12.5 MG tablet TAKE 1 TABLET(12.5 MG) BY MOUTH IN THE MORNING 90 tablet 0    ibuprofen (ADVIL) 200 MG tablet Take 600-800 mg by mouth every 6 (six) hours as needed for moderate pain.      Multiple Vitamins-Minerals (HAIR SKIN AND NAILS FORMULA) TABS Take 1 tablet by mouth daily in the afternoon.      naproxen  (NAPROSYN ) 500 MG tablet TAKE 1 TABLET BY MOUTH TWICE DAILY WITH A MEAL. TAKE WITH FLEXERIL  AT FIRST SIGN OF HEADACHE 30 tablet 2    norethindrone  (MICRONOR ) 0.35 MG tablet Take 1 tablet (0.35 mg total) by mouth daily. (Patient not taking: Reported on 08/12/2023) 84 tablet 3    ondansetron  (ZOFRAN -ODT) 4 MG disintegrating  tablet Take by mouth.       Review of Systems  Gastrointestinal:  Positive for abdominal pain and constipation. Negative for diarrhea, nausea and vomiting.  Genitourinary:  Positive for vaginal bleeding. Negative for difficulty urinating, dysuria and vaginal discharge.  Musculoskeletal:  Positive for back pain.   Physical Exam   Blood pressure 127/82, pulse 89, temperature 98.1 F (36.7 C), temperature source Oral, resp. rate 19, height 5\' 4"  (1.626 m), weight 84.9 kg, last menstrual period 07/30/2023, SpO2 99%.  Physical Exam Vitals and nursing note reviewed.  Constitutional:      Appearance: Normal appearance. She is well-developed.  HENT:     Head: Normocephalic and atraumatic.  Eyes:     Conjunctiva/sclera: Conjunctivae normal.  Cardiovascular:     Rate and Rhythm: Normal rate and regular rhythm.  Pulmonary:     Effort: Pulmonary effort is normal. No respiratory distress.  Musculoskeletal:        General: Normal range of motion.     Cervical back: Normal range of motion.  Skin:    General: Skin is warm and dry.   Neurological:     Mental Status: She is alert and oriented to person, place, and time.  Psychiatric:        Mood and Affect: Mood normal.        Behavior: Behavior normal.     MAU Course  Procedures Results for orders placed or performed during the hospital encounter of 08/22/23 (from the past 24 hours)  Wet prep, genital     Status: None   Collection Time: 08/22/23  5:14 PM   Specimen: PATH Cytology Cervicovaginal Ancillary Only  Result Value Ref Range   Yeast Wet Prep HPF POC NONE SEEN NONE SEEN   Trich, Wet Prep NONE SEEN NONE SEEN   Clue Cells Wet Prep HPF POC NONE SEEN NONE SEEN   WBC, Wet Prep HPF POC <10 <10   Sperm NONE SEEN   Urinalysis, Routine w reflex microscopic -Urine, Clean Catch     Status: Abnormal   Collection Time: 08/22/23  5:26 PM  Result Value Ref Range   Color, Urine RED (A) YELLOW   APPearance HAZY (A) CLEAR   Specific Gravity, Urine  1.005 - 1.030    TEST NOT REPORTED DUE TO COLOR INTERFERENCE OF URINE PIGMENT   pH  5.0 - 8.0    TEST NOT REPORTED DUE TO COLOR INTERFERENCE OF URINE PIGMENT   Glucose, UA (A) NEGATIVE mg/dL    TEST NOT REPORTED DUE TO COLOR INTERFERENCE OF URINE PIGMENT   Hgb urine dipstick (A) NEGATIVE    TEST NOT REPORTED DUE TO COLOR INTERFERENCE OF URINE PIGMENT   Bilirubin Urine (A) NEGATIVE    TEST NOT REPORTED DUE TO COLOR INTERFERENCE OF URINE PIGMENT   Ketones, ur (A) NEGATIVE mg/dL    TEST NOT REPORTED DUE TO COLOR INTERFERENCE OF URINE PIGMENT   Protein, ur (A) NEGATIVE mg/dL    TEST NOT REPORTED DUE TO COLOR INTERFERENCE OF URINE PIGMENT   Nitrite (A) NEGATIVE    TEST NOT REPORTED DUE TO COLOR INTERFERENCE OF URINE PIGMENT   Leukocytes,Ua (A) NEGATIVE    TEST NOT REPORTED DUE TO COLOR INTERFERENCE OF URINE PIGMENT  Urinalysis, Microscopic (reflex)     Status: Abnormal   Collection Time: 08/22/23  5:26 PM  Result Value Ref Range   RBC / HPF FIELD OBSCURED BY RBC'S 0 - 5 RBC/hpf   WBC, UA 0-5 0 - 5 WBC/hpf  Bacteria,  UA RARE (A) NONE SEEN   Squamous Epithelial / HPF 0-5 0 - 5 /HPF   Mucus PRESENT   CBC     Status: None   Collection Time: 08/22/23  5:27 PM  Result Value Ref Range   WBC 8.0 4.0 - 10.5 K/uL   RBC 4.46 3.87 - 5.11 MIL/uL   Hemoglobin 12.8 12.0 - 15.0 g/dL   HCT 62.9 52.8 - 41.3 %   MCV 86.8 80.0 - 100.0 fL   MCH 28.7 26.0 - 34.0 pg   MCHC 33.1 30.0 - 36.0 g/dL   RDW 24.4 01.0 - 27.2 %   Platelets 232 150 - 400 K/uL   nRBC 0.0 0.0 - 0.2 %  ABO/Rh     Status: None   Collection Time: 08/22/23  5:27 PM  Result Value Ref Range   ABO/RH(D) O POS    No rh immune globuloin      NOT A RH IMMUNE GLOBULIN CANDIDATE, PT RH POSITIVE Performed at [facility], [address], [place], [place] [address]   hCG, quantitative, pregnancy     Status: Abnormal   Collection Time: 08/22/23  5:27 PM  Result Value Ref Range   hCG, Beta Chain, Quant, S 6,064 (H) <5 mIU/mL   US  OB LESS THAN 14 WEEKS WITH OB TRANSVAGINAL Result Date: 08/22/2023 CLINICAL DATA:  Pregnant patient with abdominal pain. EXAM: OBSTETRIC <14 WK US  AND TRANSVAGINAL OB US  TECHNIQUE: Both transabdominal and transvaginal ultrasound examinations were performed for complete evaluation of the gestation as well as the maternal uterus, adnexal regions, and pelvic cul-de-sac. Transvaginal technique was performed to assess early pregnancy. COMPARISON:  None Available. FINDINGS: Intrauterine gestational sac: Single Yolk sac:  Not Visualized. Embryo:  Not Visualized. MSD: 6 mm   5 w   2 d Subchorionic hemorrhage:  None visualized. Maternal uterus/adnexae: The uterus is anteverted. Intrauterine gestational sac. No yolk sac or fetal pole. Small uterine fibroids, largest measuring 1.8 cm in the right uterine fundus, appears subserosal. There is complex cyst in the right ovary measuring 2.2 cm with low level echoes and reticulation. Ovarian blood flow is demonstrated. The left ovary is normal. No adnexal mass. No pelvic free fluid. IMPRESSION: 1.  Probable early intrauterine gestational sac, but no yolk sac, fetal pole, or cardiac activity yet visualized. Recommend follow-up quantitative B-HCG levels and follow-up US  in 14 days to assess viability. This recommendation follows SRU consensus guidelines: Diagnostic Criteria for Nonviable Pregnancy Early in the First Trimester. Mel Spine Med 2013; 403:4742-59. 2. Complex cyst in the right ovary may represent a hemorrhagic cyst or endometrioma. 3. Small uterine fibroids. Electronically Signed   By: Chadwick Colonel M.D.   On: 08/22/2023 20:07    MDM Physical Exam Cultures: Wet Prep and GC/CT Labs: UA, UPT, CBC, CMP, hCG, ABO Ultrasound Pain Medication Coordination of Follow Up Assessment and Plan  28 year old G1P0 at 3.2 weeks Vaginal Bleeding Abdominal Cramping  -Orders placed in triage.  -Reviewed POC with patient. -Exam performed.  -Patient offered and accepts pain medication. Will give tylenol . -Send for US  and await results. -Addressed questions regarding potential for miscarriage.   Kraig Peru 08/22/2023, 5:36 PM   Reassessment (8:15 PM) -Results as above. -Patient reports pain is "pretty low."  She rates it a 3/10 currently and states this is manageable. -Reviewed US  results.  -Informed IUGS noted and hCG levels rising.  Discussed need for repeat US  in 7-10 days. -Precautions reviewed. -US  scheduled  for Monday June 9th at 1000. -Encouraged to call primary office or return to MAU if symptoms worsen or with the onset of new symptoms. -Discharged to home in improved condition.  Kraig Peru MSN, CNM Advanced Practice Provider, [facility] for [employer]

## 2023-08-23 LAB — GC/CHLAMYDIA PROBE AMP (~~LOC~~) NOT AT ARMC
Chlamydia: NEGATIVE
Comment: NEGATIVE
Comment: NORMAL
Neisseria Gonorrhea: NEGATIVE

## 2023-09-02 ENCOUNTER — Ambulatory Visit (INDEPENDENT_AMBULATORY_CARE_PROVIDER_SITE_OTHER): Admitting: Family Medicine

## 2023-09-02 ENCOUNTER — Other Ambulatory Visit: Payer: Self-pay

## 2023-09-02 ENCOUNTER — Other Ambulatory Visit

## 2023-09-02 ENCOUNTER — Ambulatory Visit (INDEPENDENT_AMBULATORY_CARE_PROVIDER_SITE_OTHER)

## 2023-09-02 DIAGNOSIS — O3680X0 Pregnancy with inconclusive fetal viability, not applicable or unspecified: Secondary | ICD-10-CM | POA: Diagnosis not present

## 2023-09-02 DIAGNOSIS — Z3A01 Less than 8 weeks gestation of pregnancy: Secondary | ICD-10-CM

## 2023-09-02 DIAGNOSIS — O200 Threatened abortion: Secondary | ICD-10-CM | POA: Diagnosis not present

## 2023-09-02 DIAGNOSIS — Z3A00 Weeks of gestation of pregnancy not specified: Secondary | ICD-10-CM | POA: Diagnosis not present

## 2023-09-02 NOTE — Assessment & Plan Note (Signed)
 Previous ultrasound 10 days ago showing gestational sac with no yolk sac.  Ultrasound performed today showing similarly measuring gestational sac with now yolk sac present.  Because it has not been 14 days we cannot assess viability.  Patient is no longer bleeding and there has been changes in the gestational sac with yolk sac now present.  Discussed the options and will repeat viability scan in 2 week.  Discussed MAU precautions.  No further questions or concerns.

## 2023-09-02 NOTE — Progress Notes (Signed)
    Subjective:  Donna Galloway is a 28 y.o. female who presents to the clinic today for viability ultrasound.  HPI:  Patient was seen in the MAU on 5/29 for threatened miscarriage.  Ultrasound performed showing gestational sac without yolk sac.  Patient no longer bleeding and does report she had intercourse prior to her visit to the emergency department.  Was scheduled for viability ultrasound today.  Objective:  Physical Exam: LMP 07/30/2023 (Exact Date)   Gen: Alert, NAD, resting comfortably CV: RRR with no murmurs appreciated Pulm: NWOB, CTAB with no crackles, wheezes, or rhonchi GI: Normal bowel sounds present. Soft, Nontender, Nondistended. MSK: no edema, cyanosis, or clubbing noted Skin: warm, dry Neuro: grossly normal, moves all extremities Psych: Normal affect and thought content  No results found for this or any previous visit (from the past 72 hours).   Assessment/Plan:  Pregnancy with uncertain fetal viability Previous ultrasound 10 days ago showing gestational sac with no yolk sac.  Ultrasound performed today showing similarly measuring gestational sac with now yolk sac present.  Because it has not been 14 days we cannot assess viability.  Patient is no longer bleeding and there has been changes in the gestational sac with yolk sac now present.  Discussed the options and will repeat viability scan in 2 week.  Discussed MAU precautions.  No further questions or concerns.   Lab Orders  No laboratory test(s) ordered today    No orders of the defined types were placed in this encounter.     Janna Melter, MD Attending Family Medicine Physician, [facility], [facility]   09/02/23 3:14 PM

## 2023-09-05 ENCOUNTER — Encounter (INDEPENDENT_AMBULATORY_CARE_PROVIDER_SITE_OTHER): Payer: Self-pay

## 2023-09-16 ENCOUNTER — Other Ambulatory Visit: Payer: Self-pay

## 2023-09-16 ENCOUNTER — Ambulatory Visit (INDEPENDENT_AMBULATORY_CARE_PROVIDER_SITE_OTHER): Payer: Self-pay

## 2023-09-16 ENCOUNTER — Ambulatory Visit: Payer: Self-pay | Admitting: Family Medicine

## 2023-09-16 VITALS — BP 122/77 | HR 92

## 2023-09-16 DIAGNOSIS — Z3A00 Weeks of gestation of pregnancy not specified: Secondary | ICD-10-CM | POA: Diagnosis not present

## 2023-09-16 DIAGNOSIS — O3680X0 Pregnancy with inconclusive fetal viability, not applicable or unspecified: Secondary | ICD-10-CM

## 2023-09-16 DIAGNOSIS — R110 Nausea: Secondary | ICD-10-CM

## 2023-09-16 DIAGNOSIS — O039 Complete or unspecified spontaneous abortion without complication: Secondary | ICD-10-CM

## 2023-09-16 DIAGNOSIS — R52 Pain, unspecified: Secondary | ICD-10-CM

## 2023-09-16 DIAGNOSIS — Z3687 Encounter for antenatal screening for uncertain dates: Secondary | ICD-10-CM | POA: Diagnosis not present

## 2023-09-16 MED ORDER — IBUPROFEN 600 MG PO TABS: 600.0000 mg | ORAL_TABLET | Freq: Four times a day (QID) | ORAL | 3 refills | Status: DC | PRN

## 2023-09-16 MED ORDER — OXYCODONE HCL 5 MG PO TABS
5.0000 mg | ORAL_TABLET | ORAL | 0 refills | Status: DC | PRN
Start: 2023-09-16 — End: 2023-12-30

## 2023-09-16 MED ORDER — ONDANSETRON 4 MG PO TBDP: 4.0000 mg | ORAL_TABLET | Freq: Three times a day (TID) | ORAL | 0 refills | Status: DC | PRN

## 2023-09-16 MED ORDER — MISOPROSTOL 200 MCG PO TABS: ORAL_TABLET | ORAL | 1 refills | Status: DC

## 2023-09-16 NOTE — Patient Instructions (Addendum)
 CYTOTEC MANAGEMENT Prostaglandins (cytotec) are the most widely used drug for this purpose. They cause the uterus to cramp and contract. You will place the medicine yourself in the privacy of your home. Empting of the uterus should occur within 3 days but the process may continue for several weeks. The bleeding may seem heavy at times.  I just sent the medication cytotec to the pharmacy. You will place 800mcg (4 pills) in your vagina or 2 pills on each side of your mouth between your gum and your cheek. This will facilitate passage of the pregnancy.   If you have not started bleeding within 12 hours, please pick up the refill and repeat the dose.  Risk/Side effects: - Cramping and contraction like pain. This is related to the passage of the pregnancy. You can take the ibuprofen or oxycodone that I sent to the pharmacy. Ibuprofen is especially helpful for cramping pain. - Bleeding which is expected and usually not severe. - Mild nausea or GI upset. I have sent in a medication called Zofran  in case you have this side effect - It might not work which would require a procedure.  Benefits: - This medication increases the likelihood you will pass the pregnancy. The failure rate is about 15-20% - Shortens the time to passing your pregnancy. When you wait to pass the pregnancy on your own it can take 2-4 weeks. But usually if this medication is going to work it will help you pass the pregnancy within 24 hours and you can repeat the dose if needed.   After taking this medication we want you to pass clots and blood. This is normal. We usually repeat you ultrasound in about 2-3 weeks to make sure everything has passed.   You would seek care at the hospital for - Dizziness, lightheadedness - Severe abdominal pain - Fill 1 pad per hour of blood - Fever or chills - any other concern

## 2023-09-16 NOTE — Progress Notes (Signed)
 History:  Ms. Donna Galloway is a 28 y.o. G1P0 who presents to clinic today for viability ultrasound.  HPI: Patient was seen in MAU 5/29 for threatened miscarriage. US  that day showed gestational sac without yolk sac. Repeat US  scheduled for 6/9 showed gestational sac of similar measurement and new yolk sac present. Patient no longer bleeding at that time. Scheduled repeat viability scan for today.  The following portions of the patient's history were reviewed and updated as appropriate: allergies, current medications, family history, past medical history, social history, past surgical history and problem list.  Review of Systems:  ROS See HPI  Objective:  Physical Exam BP 122/77   Pulse 92   LMP 07/30/2023 (Exact Date)  Physical Exam Constitutional: Well-developed, well-nourished female in no acute distress.  HEENT: atraumatic, normocephalic. Neck has normal ROM. EOM intact. Cardiovascular: normal rate and rhythm, warm and well perfused Respiratory: normal effort, no distress GI: Abd soft, non-tender, non-distended MS: Extremities nontender, no edema, normal ROM Neurologic: Alert and oriented x 4.   Grossly nonfocal. Skin:  Warm and Dry  Labs and Imaging No results found for this or any previous visit (from the past 24 hours).  No results found.   Assessment & Plan:  1. Spontaneous abortion in first trimester (Primary) US  images reviewed by Dr. Ilean. Today's images show gestational sac but no yolk sac or fetal pole. Given 2 weeks since previous imaging without development of fetal pole, meets criteria for failed pregnancy. Discussed options for management including expectant management, medication, and procedure. Patient would like to move forward with medication management. - misoprostol (CYTOTEC) 200 MCG tablet; Place four tablets in between your gums and cheeks (two tablets on each side) as instructed. Alternatively, you may place the four tablets in the vagina.  Dispense:  4 tablet; Refill: 1 - ibuprofen (ADVIL) 600 MG tablet; Take 1 tablet (600 mg total) by mouth every 6 (six) hours as needed.  Dispense: 30 tablet; Refill: 3 - ondansetron  (ZOFRAN -ODT) 4 MG disintegrating tablet; Take 1 tablet (4 mg total) by mouth every 8 (eight) hours as needed for nausea or vomiting.  Dispense: 20 tablet; Refill: 0 - oxyCODONE (OXY IR/ROXICODONE) 5 MG immediate release tablet; Take 1 tablet (5 mg total) by mouth every 4 (four) hours as needed for severe pain (pain score 7-10) or breakthrough pain.  Dispense: 10 tablet; Refill: 0  Early Intrauterine Pregnancy Failure Protocol Discussed options for management including expectant management, medication, and procedure. Patient would like to move forward with medication management.  X  Documented intrauterine pregnancy failure less than or equal to [redacted] weeks gestation  X  No serious current illness  X  Baseline Hgb greater than or equal to 10g/dl  X  Patient has easily accessible transportation to the hospital  X  Clear preference  X  Practitioner/physician deems patient reliable  X  Counseling by practitioner or physician  X  Patient education by RN  X  Consent form signed       Rho-Gam given by RN if indicated  X  Medication dispensed  X  Cytotec 800 mcg  X  Intravaginally by patient at home       Buccally by patient at home      Intravaginally by NP in MAU       Rectally by patient at home       Rectally by RN in MAU  X   Ibuprofen 600 mg 1 tablet by mouth every 6 hours as needed #30 -  prescribed  X   Oxycodone 5 mg tablet by mouth every 4 hours as needed for pain - prescribed  X   Zofran  ODT 4 mg by mouth every 8 hours as needed for nausea - prescribed  Reviewed with pt cytotec procedure.  Pt verbalizes that she lives close to the hospital and has transportation readily available.  Pt appears reliable and verbalizes understanding and agrees with plan of care.  Patient would like to follow up after she has passed the  pregnancy. She wants to change her appointment on 7/3 to be a follow up for today's visit.  Joesph DELENA Sear, PA

## 2023-09-23 ENCOUNTER — Encounter

## 2023-09-24 ENCOUNTER — Encounter: Payer: Self-pay | Admitting: Obstetrics and Gynecology

## 2023-09-24 ENCOUNTER — Encounter: Payer: Self-pay | Admitting: Family Medicine

## 2023-09-24 DIAGNOSIS — N83291 Other ovarian cyst, right side: Secondary | ICD-10-CM | POA: Insufficient documentation

## 2023-09-30 ENCOUNTER — Encounter: Payer: Self-pay | Admitting: Obstetrics and Gynecology

## 2023-09-30 ENCOUNTER — Ambulatory Visit: Payer: Self-pay | Admitting: Obstetrics and Gynecology

## 2023-09-30 VITALS — BP 124/86 | HR 85 | Wt 188.0 lb

## 2023-09-30 DIAGNOSIS — R635 Abnormal weight gain: Secondary | ICD-10-CM

## 2023-09-30 DIAGNOSIS — O039 Complete or unspecified spontaneous abortion without complication: Secondary | ICD-10-CM

## 2023-09-30 DIAGNOSIS — D219 Benign neoplasm of connective and other soft tissue, unspecified: Secondary | ICD-10-CM

## 2023-09-30 DIAGNOSIS — N83291 Other ovarian cyst, right side: Secondary | ICD-10-CM | POA: Diagnosis not present

## 2023-09-30 DIAGNOSIS — F439 Reaction to severe stress, unspecified: Secondary | ICD-10-CM

## 2023-09-30 NOTE — Progress Notes (Signed)
   RETURN GYNECOLOGY VISIT  Subjective:  Donna Galloway is a 28 y.o. G1P0010 with recent early pregnancy loss presenting for follow up.   Met criteria for missed AB on 6/23 (2 weeks ago). Took cytotec . Had increased bleeding/cramping and passed pregnancy tissue then symptoms quickly improved/resolved. Doing OK now, but mood has been up and down. Had a panic attack in [employer] last week.   Saw her US  results from original scan which noted a 2.2cm complex R ovarian cyst and small fibroids (no measurement). She'd like to discuss follow up plan for these. Notes regular but painful & heavy menstrual cycles. Pain is so bad with periods that she gets nauseated.   Also notes problems with weight gain on Depo. Tried switching to POPs without improvement. Tries to avoid estrogen containing methods due to history of migraines.   Has overall just struggled with her health this year particularly related to weight gain, back pain. Insurance denied a breast reduction because of her weight.    Objective:   Vitals:   09/30/23 1601  BP: 124/86  Pulse: 85  Weight: 188 lb (85.3 kg)   General:  Alert, oriented and cooperative. Patient is in no acute distress.  Skin: Skin is warm and dry. No rash noted.   Cardiovascular: Normal heart rate noted  Respiratory: Normal respiratory effort, no problems with respiration noted   Assessment and Plan:  Chicquita Mendel is a 28 y.o. with the following:  SAB (spontaneous abortion) - Discussed options for follow up include home UPT in 2 weeks or pelvic US . Given her ovarian cyst and fibroids, she prefers follow up with pelvic US  - ordered - Condoms for contraception, thinks they want to try again next year - Reviewed that genetic issue is most likely cause of SAB and that most couples will go on to have healthy normal pregnancy next time they conceive -     US  PELVIC COMPLETE WITH TRANSVAGINAL; Future -     [facility] ref to [facility]  Fibroids Complex  cyst of right ovary Will trial scheduled NSAIDs for control of period symptoms. If ineffective, will try lysteda -     US  PELVIC COMPLETE WITH TRANSVAGINAL; Future  Weight gain Recent A1c 4.9 (01/2023) -     TSH Rfx on Abnormal to Free T4; Future  Stress -     Amb ref to [facility]  Return in 25 days (on 10/25/2023) for follow up ultrasound .  Future Appointments  Date Time Provider Department Center  10/03/2023  3:00 PM [facility]  2 [facility]  [place]   Kieth JAYSON Carolin, MD

## 2023-09-30 NOTE — Progress Notes (Signed)
 Pt is in office for SAB follow up.   Pt states that on initial u/s she had noted ovarian cyst and fibroid - would like to discuss.   Pt states she has been having hunger pains but not hungry.   Pt would like to discuss BC options - pt does have hx of migraines.

## 2023-10-01 ENCOUNTER — Other Ambulatory Visit

## 2023-10-01 ENCOUNTER — Encounter: Payer: Self-pay | Admitting: Obstetrics and Gynecology

## 2023-10-01 DIAGNOSIS — R635 Abnormal weight gain: Secondary | ICD-10-CM

## 2023-10-02 ENCOUNTER — Ambulatory Visit: Payer: Self-pay | Admitting: Obstetrics and Gynecology

## 2023-10-02 LAB — TSH RFX ON ABNORMAL TO FREE T4: TSH: 0.843 u[IU]/mL (ref 0.450–4.500)

## 2023-10-03 ENCOUNTER — Ambulatory Visit (HOSPITAL_COMMUNITY)
Admission: RE | Admit: 2023-10-03 | Discharge: 2023-10-03 | Disposition: A | Discharge status: Home / Self Care | Source: Ambulatory Visit | Attending: Obstetrics and Gynecology | Admitting: Obstetrics and Gynecology

## 2023-10-03 DIAGNOSIS — N83291 Other ovarian cyst, right side: Secondary | ICD-10-CM | POA: Insufficient documentation

## 2023-10-03 DIAGNOSIS — O039 Complete or unspecified spontaneous abortion without complication: Secondary | ICD-10-CM | POA: Insufficient documentation

## 2023-10-03 DIAGNOSIS — R9389 Abnormal findings on diagnostic imaging of other specified body structures: Secondary | ICD-10-CM | POA: Diagnosis not present

## 2023-10-03 DIAGNOSIS — D252 Subserosal leiomyoma of uterus: Secondary | ICD-10-CM | POA: Diagnosis not present

## 2023-10-03 DIAGNOSIS — N83209 Unspecified ovarian cyst, unspecified side: Secondary | ICD-10-CM | POA: Diagnosis not present

## 2023-10-06 DIAGNOSIS — F4320 Adjustment disorder, unspecified: Secondary | ICD-10-CM | POA: Diagnosis not present

## 2023-10-13 DIAGNOSIS — F4322 Adjustment disorder with anxiety: Secondary | ICD-10-CM | POA: Diagnosis not present

## 2023-10-18 ENCOUNTER — Ambulatory Visit: Payer: Self-pay | Admitting: Licensed Clinical Social Worker

## 2023-10-18 DIAGNOSIS — F4323 Adjustment disorder with mixed anxiety and depressed mood: Secondary | ICD-10-CM

## 2023-10-18 DIAGNOSIS — F4321 Adjustment disorder with depressed mood: Secondary | ICD-10-CM

## 2023-10-18 NOTE — BH Specialist Note (Unsigned)
 Integrated Behavioral Health via Telemedicine Visit  10/25/2023 Donna Galloway [id number]  Number of Integrated Behavioral Health Clinician visits: 1- Initial Visit  Session Start time: 0815   Session End time: 0913  Total time in minutes: 58    Referring Provider: Dr. Erik Patient/Family location: In the car  [facility] Provider location: Home All persons participating in visit: Patient and [facility] Types of Service: Individual psychotherapy and Video visit  I connected with Donna Galloway and/or Donna Galloway's patient via  Telephone or Engineer, civil (consulting)  (Video is Caregility application) and verified that I am speaking with the correct person using two identifiers. Discussed confidentiality: Yes   I discussed the limitations of telemedicine and the availability of in person appointments.  Discussed there is a possibility of technology failure and discussed alternative modes of communication if that failure occurs.  I discussed that engaging in this telemedicine visit, they consent to the provision of behavioral healthcare and the services will be billed under their insurance.  Patient and/or legal guardian expressed understanding and consented to Telemedicine visit: Yes   Presenting Concerns: Patient and/or family reports the following symptoms/concerns: increased depression and anxiety symptoms. Recent loss.  Duration of problem: Months; Severity of problem: moderate  Patient and/or Family's Strengths/Protective Factors: Social and Emotional competence, Concrete supports in place (healthy food, safe environments, etc.), Sense of purpose, Physical Health (exercise, healthy diet, medication compliance, etc.), and Parental Resilience  Goals Addressed: Patient will:  Reduce symptoms of: anxiety and depression   Increase knowledge and/or ability of: coping skills, healthy habits, and self-management skills   Demonstrate ability to: Increase healthy adjustment to  current life circumstances and Begin healthy grieving over loss  Progress towards Goals: Ongoing    Interventions: Interventions utilized:  Mindfulness or Management consultant, Supportive Counseling, Psychoeducation and/or Health Education, Communication Skills, and Supportive Reflection Standardized Assessments completed: Not Needed    Patient and/or Family Response: Patient was present for virtual session on this date. 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 She reports recently getting married back in March of 2025 and also experiencing a miscarriage in June of 2025. Patient reports experiencing increased panic attacks and self-blaming and guilt regarding the c section as if she's at fault for putting her baby through a lot of stress. She mentioned having two step children and reports she and her husband recently experiencing a lot of legal issues which resulted in them receiving joint custody of the children. Patient reports she has had a difficult time managing work life and home life. She reports not feeling satisfied about her career and current job. She has found it hard to stay focused and motivated at work. Patient reports going to the gym has helped her with her stress level and emotional regulation. Patient reports understanding of the grieving process as well as trauma and how it may impact the body and brain differently.   Clinical Assessment/Diagnosis  No diagnosis found.    Assessment: Patient currently experiencing ***.   Patient may benefit from continued support of integrated behavioral health services.  Plan: Follow up with behavioral health clinician on : 8/15 Behavioral recommendations: *** Referral(s): Integrated [employer] (In Clinic)  I discussed the assessment and treatment plan with the patient and/or parent/guardian. They  were provided an opportunity to ask questions and all were answered. They agreed with the plan and demonstrated an understanding  of the instructions.   They were advised to call back or seek an in-person evaluation if the symptoms worsen or if the condition fails to improve as anticipated.  Donna Galloway, LCSWA

## 2023-10-21 ENCOUNTER — Other Ambulatory Visit: Payer: Self-pay

## 2023-10-21 DIAGNOSIS — R635 Abnormal weight gain: Secondary | ICD-10-CM

## 2023-10-25 ENCOUNTER — Ambulatory Visit: Admitting: Licensed Clinical Social Worker

## 2023-10-25 DIAGNOSIS — F4323 Adjustment disorder with mixed anxiety and depressed mood: Secondary | ICD-10-CM

## 2023-10-25 DIAGNOSIS — F4321 Adjustment disorder with depressed mood: Secondary | ICD-10-CM

## 2023-10-25 NOTE — BH Specialist Note (Signed)
 Integrated Behavioral Health via Telemedicine Visit   Donna Galloway [id number]  Number of Integrated Behavioral Health Clinician visits: 2- Second Visit  Session Start time: 0815   Session End time: 0920  Total time in minutes: 65    Referring Provider: Dr. Erik Patient/Family location: At [facility] Provider location: Remote Office All persons participating in visit: Patient and [name] Types of Service: Individual psychotherapy and Video visit  I connected with [name] and/or [name]'s patient via  Telephone or Engineer, civil (consulting)  (Video is Caregility application) and verified that I am speaking with the correct person using two identifiers. Discussed confidentiality: Yes   I discussed the limitations of telemedicine and the availability of in person appointments.  Discussed there is a possibility of technology failure and discussed alternative modes of communication if that failure occurs.  I discussed that engaging in this telemedicine visit, they consent to the provision of behavioral healthcare and the services will be billed under their insurance.  Patient and/or legal guardian expressed understanding and consented to Telemedicine visit: Yes   Presenting Concerns: Patient and/or family reports the following symptoms/concerns:Ongoing challenges navigating grief. Increased work related stress.  Duration of problem: Months; Severity of problem: moderate  Patient and/or Family's Strengths/Protective Factors: Social connections, Concrete supports in place (healthy food, safe environments, etc.), Physical Health (exercise, healthy diet, medication compliance, etc.), and Parental Resilience  Goals Addressed: Patient will:  Reduce symptoms of: anxiety and depression   Increase knowledge and/or ability of: coping skills, healthy habits, and self-management skills   Demonstrate ability to: Increase healthy adjustment to current life  circumstances and Begin healthy grieving over loss  Progress towards Goals: Ongoing   Interventions: Interventions utilized:  Solution-Focused Strategies, Mindfulness or Management consultant, Psychoeducation and/or Health Education, and Supportive Reflection Standardized Assessments completed: Not Needed  Patient and/or Family Response: Patient was present for today's virtual session. She reported that her initial session was helpful and that she has since experienced improved focus and engagement at work, despite continued dissatisfaction with her current role. Patient acknowledged a shift in perspective, now focusing on the positive aspects of her job, including financial stability, benefits, and the ability to provide for her family. She shared efforts to enhance her daily work experience by incorporating short breaks and engaging in brief conversations with coworkers. Patient reported experiencing her menstrual cycle for the first time since the miscarriage, noting irregularity but feeling supported by her family during this period of adjustment. She endorsed significant fatigue over the past week, attributing it to a combination of work demands, family responsibilities, and maintaining a daily exercise routine. Patient demonstrated insight into the physiological impacts of stress, anxiety, and depression, recognizing their potential contribution to her current level of exhaustion.   Clinical Assessment/Diagnosis  Adjustment disorder with mixed anxiety and depressed mood  Grief associated with loss of fetus    Assessment: Patient currently experiencing  physical fatigue and emotional strain related to work demands, family responsibilities, and recent hormonal changes following a miscarriage. She is also adjusting to ongoing grief while working to maintain focus and a positive mindset despite continued dissatisfaction with her job..   Patient may benefit from continued support of integrated  behavioral health services.  Plan: Follow up with behavioral health clinician on : 11/08/2023 Behavioral recommendations: patient to continue to prioritize self-care strategies such as regular breaks, supportive social interaction, and physical activity, while also exploring ways to reduce workload strain. Continued therapy is encouraged to support emotional processing, monitor fatigue  levels, and build resilience during this transitional period. Referral(s): Integrated [employer] (In Clinic)  I discussed the assessment and treatment plan with the patient and/or parent/guardian. They were provided an opportunity to ask questions and all were answered. They agreed with the plan and demonstrated an understanding of the instructions.   They were advised to call back or seek an in-person evaluation if the symptoms worsen or if the condition fails to improve as anticipated.  Annie Roseboom LITTIE Seats, LCSWA

## 2023-10-27 DIAGNOSIS — F4320 Adjustment disorder, unspecified: Secondary | ICD-10-CM | POA: Diagnosis not present

## 2023-11-08 ENCOUNTER — Ambulatory Visit: Payer: Self-pay | Admitting: Licensed Clinical Social Worker

## 2023-11-08 DIAGNOSIS — R69 Illness, unspecified: Secondary | ICD-10-CM

## 2023-11-08 NOTE — BH Specialist Note (Signed)
 Integrated Behavioral Health via Telemedicine Visit  11/08/2023 Donna Galloway [id number]  Patient connected virtually and advised she is at work today and was not able to work from home. Patient reschedule this appointment to next Monday, 11/11/2023.   [name] Seats, LCSWA

## 2023-11-11 ENCOUNTER — Ambulatory Visit (INDEPENDENT_AMBULATORY_CARE_PROVIDER_SITE_OTHER): Payer: Self-pay | Admitting: Licensed Clinical Social Worker

## 2023-11-11 DIAGNOSIS — F4323 Adjustment disorder with mixed anxiety and depressed mood: Secondary | ICD-10-CM

## 2023-11-11 DIAGNOSIS — F4321 Adjustment disorder with depressed mood: Secondary | ICD-10-CM

## 2023-11-11 NOTE — BH Specialist Note (Unsigned)
 Integrated Behavioral Health via Telemedicine Visit  11/14/2023 Donna Galloway [id number]  Number of Integrated Behavioral Health Clinician visits: 3- Third Visit  Session Start time: 0815   Session End time: 0848  Total time in minutes: 33    Referring Provider: Dr. Erik  Patient/Family location: At home [facility] Provider location: Remote Office All persons participating in visit: Patient and [facility] Types of Service: Individual psychotherapy and Video visit  I connected with Donna Galloway and/or Donna Buttrey's patient via  Telephone or Engineer, civil (consulting)  (Video is Caregility application) and verified that I am speaking with the correct person using two identifiers. Discussed confidentiality: Yes   I discussed the limitations of telemedicine and the availability of in person appointments.  Discussed there is a possibility of technology failure and discussed alternative modes of communication if that failure occurs.  I discussed that engaging in this telemedicine visit, they consent to the provision of behavioral healthcare and the services will be billed under their insurance.  Patient and/or legal guardian expressed understanding and consented to Telemedicine visit: Yes   Presenting Concerns: Patient and/or family reports the following symptoms/concerns: Improvements with mood Duration of problem: Months; Severity of problem: moderate  Patient and/or Family's Strengths/Protective Factors: Social and Emotional competence, Concrete supports in place (healthy food, safe environments, etc.), and Physical Health (exercise, healthy diet, medication compliance, etc.)  Goals Addressed: Patient will:  Reduce symptoms of: anxiety and depression   Increase knowledge and/or ability of: coping skills, healthy habits, and self-management skills   Demonstrate ability to: Increase healthy adjustment to current life circumstances and Increase adequate support systems for  patient/family  Progress towards Goals: Achieved    Interventions: Interventions utilized:  Supportive Counseling, Psychoeducation and/or Health Education, and Supportive Reflection Standardized Assessments completed: Not Needed    Patient and/or Family Response: Patient attended today's scheduled virtual session. She reported having a good weekend and celebrated her husband's grandmother's birthday. Patient stated she has been feeling well overall, with no recent depressive or anxiety episodes, and nothing particularly concerning standing out at this time. She shared that work has been manageable and somewhat easier to navigate recently. Patient has continued incorporating physical activity into her routine, including weightlifting and, more recently, cycling, which she described as helpful in reducing anxiety and depressive symptoms. She noted that exercise contributes to a greater sense of emotional clarity and physical well-being. During the session, patient processed her emotional response to learning that her sister-in-law is pregnant following her own recent pregnancy loss. She expressed feeling supported by her family and shared that she received ongoing check-ins from loved ones. Patient stated she took the news well and believes she is progressing in her grieving process. She agreed to follow up with [facility] services when needed.   Clinical Assessment/Diagnosis  Adjustment disorder with mixed anxiety and depressed mood  Grief associated with loss of fetus    Assessment: Patient currently experiencing motional stability, reporting no recent episodes of depression or anxiety. She is actively engaging in healthy coping strategies, such as regular exercise, and is processing her grief in a supported and adaptive manner..   Patient may benefit from continued support of behavioral health services.  Plan: Follow up with behavioral health clinician on : Patient will follow up when needed.   Behavioral recommendations: Recommend continued engagement in physical activity and self-care routines as effective coping strategies for emotional regulation. Follow up to [facility] when needed.  Referral(s): Integrated [employer] (In Clinic)  I discussed the assessment  and treatment plan with the patient and/or parent/guardian. They were provided an opportunity to ask questions and all were answered. They agreed with the plan and demonstrated an understanding of the instructions.   They were advised to call back or seek an in-person evaluation if the symptoms worsen or if the condition fails to improve as anticipated.  Tonni Mansour LITTIE Seats, LCSWA

## 2023-11-28 ENCOUNTER — Ambulatory Visit (INDEPENDENT_AMBULATORY_CARE_PROVIDER_SITE_OTHER): Admitting: Adult Health

## 2023-11-28 ENCOUNTER — Encounter (INDEPENDENT_AMBULATORY_CARE_PROVIDER_SITE_OTHER): Payer: Self-pay | Admitting: Adult Health

## 2023-11-28 VITALS — BP 112/79 | HR 70 | Temp 98.3°F | Ht 64.0 in | Wt 180.0 lb

## 2023-11-28 DIAGNOSIS — Z0289 Encounter for other administrative examinations: Secondary | ICD-10-CM

## 2023-11-28 DIAGNOSIS — E559 Vitamin D deficiency, unspecified: Secondary | ICD-10-CM | POA: Diagnosis not present

## 2023-11-28 DIAGNOSIS — K5901 Slow transit constipation: Secondary | ICD-10-CM | POA: Diagnosis not present

## 2023-11-28 DIAGNOSIS — Z0000 Encounter for general adult medical examination without abnormal findings: Secondary | ICD-10-CM

## 2023-11-28 DIAGNOSIS — E669 Obesity, unspecified: Secondary | ICD-10-CM

## 2023-11-28 DIAGNOSIS — G43109 Migraine with aura, not intractable, without status migrainosus: Secondary | ICD-10-CM | POA: Diagnosis not present

## 2023-11-28 DIAGNOSIS — Z6830 Body mass index (BMI) 30.0-30.9, adult: Secondary | ICD-10-CM

## 2023-11-28 NOTE — Progress Notes (Signed)
 Office: [phone]  /  Fax: [phone]   Initial Visit    Amariyana Heacox was seen in clinic today to evaluate for obesity. She is interested in losing weight to improve overall health and reduce the risk of weight related complications. She presents today to review program treatment options, initial physical assessment, and evaluation.     She was referred by: Self-Referral  When asked what else they would like to accomplish? She states: Adopt a healthier eating pattern and lifestyle, Improve energy levels and physical activity, Improve existing medical conditions, Improve quality of life, and Achieve BMI < 27 to qualify for bilateral breast reduction.  When asked how has your weight affected you? She states: Other: Pendulous Breasts  Weight history: Steady weight gain the last 4 years  Highest weight: 185 lbs  Some associated conditions: None  Contributing factors: ???  Weight promoting medications identified: None  Prior weight loss attempts: Tracking and Journaling  Current nutrition plan: High-protein and Portion control / smart choices  Current level of physical activity: Running / Jogging 30 minutes, three and Strength training 30 minutes, three  Current or previous pharmacotherapy: None  Response to medication: Never tried medications   Past medical history includes:   Past Medical History:  Diagnosis Date   Anxiety    Depression    Objective    BP 112/79   Pulse 70   Temp 98.3 F (36.8 C)   Ht 5' 4 (1.626 m)   Wt 180 lb (81.6 kg)   SpO2 99%   BMI 30.90 kg/m  She was weighed on the bioimpedance scale: Body mass index is 30.9 kg/m.  Body Fat%:34.2, Visceral Fat Rating:5, Weight trend over the last 12 months: Increasing  General:  Alert, oriented and cooperative. Patient is in no acute distress.  Respiratory: Normal respiratory effort, no problems with respiration noted   Gait: able to ambulate independently  Mental Status: Normal mood and affect.  Normal behavior. Normal judgment and thought content.   DIAGNOSTIC DATA REVIEWED:  BMET    Component Value Date/Time   NA 139 02/09/2022 0952   K 4.1 02/09/2022 0952   CL 109 02/09/2022 0952   CO2 25 02/09/2022 0952   GLUCOSE 94 02/09/2022 0952   BUN 16 02/09/2022 0952   CREATININE 0.85 02/09/2022 0952   CALCIUM 8.7 02/09/2022 0952   GFRNONAA >90 01/23/2014 1715   GFRAA >90 01/23/2014 1715   Lab Results  Component Value Date   HGBA1C 4.9 02/15/2023   No results found for: INSULIN CBC    Component Value Date/Time   WBC 8.0 08/22/2023 1727   RBC 4.46 08/22/2023 1727   HGB 12.8 08/22/2023 1727   HCT 38.7 08/22/2023 1727   PLT 232 08/22/2023 1727   MCV 86.8 08/22/2023 1727   MCH 28.7 08/22/2023 1727   MCHC 33.1 08/22/2023 1727   RDW 11.8 08/22/2023 1727   Iron/TIBC/Ferritin/ %Sat No results found for: IRON, TIBC, FERRITIN, IRONPCTSAT Lipid Panel     Component Value Date/Time   CHOL 151 02/09/2022 0952   TRIG 38.0 02/09/2022 0952   HDL 47.50 02/09/2022 0952   CHOLHDL 3 02/09/2022 0952   VLDL 7.6 02/09/2022 0952   LDLCALC 96 02/09/2022 0952   Hepatic Function Panel     Component Value Date/Time   PROT 7.0 02/09/2022 0952   ALBUMIN 4.1 02/09/2022 0952   AST 13 02/09/2022 0952   ALT 10 02/09/2022 0952   ALKPHOS 54 02/09/2022 0952   BILITOT 0.4 02/09/2022 9047  Component Value Date/Time   TSH 0.843 10/01/2023 1603   TSH 0.59 02/09/2022 0952     Assessment and Plan   Vitamin D deficiency  Migraine with aura and without status migrainosus, not intractable  Slow transit constipation  Healthcare maintenance  Obesity (BMI 30-39.9), STARTING BMI 31.0   Assessment and Plan          ESTABLISH WITH [facility]   Obesity Treatment / Action Plan:  Patient will work on garnering support from family and friends to begin weight loss journey. Will work on eliminating or reducing the presence of highly palatable, calorie dense foods in the  home. Will complete provided nutritional and psychosocial assessment questionnaire before the next appointment. Will be scheduled for indirect calorimetry to determine resting energy expenditure in a fasting state.  This will allow us  to create a reduced calorie, high-protein meal plan to promote loss of fat mass while preserving muscle mass. Counseled on the health benefits of losing 5%-15% of total body weight. Was counseled on nutritional approaches to weight loss and benefits of reducing processed foods and consuming plant-based foods and high quality protein as part of nutritional weight management. Was counseled on pharmacotherapy and role as an adjunct in weight management.   Obesity Education Performed Today:  She was weighed on the bioimpedance scale and results were discussed and documented in the synopsis.  We discussed obesity as a disease and the importance of a more detailed evaluation of all the factors contributing to the disease.  We discussed the importance of long term lifestyle changes which include nutrition, exercise and behavioral modifications as well as the importance of customizing this to her specific health and social needs.  We discussed the benefits of reaching a healthier weight to alleviate the symptoms of existing conditions and reduce the risks of the biomechanical, metabolic and psychological effects of obesity.  We reviewed the four pillars of obesity medicine and importance of using a multimodal approach.  We reviewed the basic principles in weight management.   Kimari Marlowe appears to be in the action stage of change and states they are ready to start intensive lifestyle modifications and behavioral modifications.  I have spent 25 minutes in the care of the patient today including: 2 minutes before the visit reviewing and preparing the chart. 20 minutes face-to-face assessing and reviewing listed medical problems as outlined in obesity care plan,  providing nutritional and behavioral counseling on topics outlined in the obesity care plan, counseling regarding anti-obesity medication as outlined in obesity care plan, independently interpreting test results and goals of care, as described in assessment and plan, and reviewing and discussing biometric information and progress 3 minutes after the visit updating chart and documentation of encounter.  Reviewed by clinician on day of visit: allergies, medications, problem list, medical history, surgical history, family history, social history, and previous encounter notes pertinent to obesity diagnosis.  Ressie Slevin d. Rashonda Warrior, NP-C

## 2023-12-23 DIAGNOSIS — F4322 Adjustment disorder with anxiety: Secondary | ICD-10-CM | POA: Diagnosis not present

## 2023-12-30 ENCOUNTER — Encounter: Payer: Self-pay | Admitting: Family

## 2023-12-30 ENCOUNTER — Ambulatory Visit (INDEPENDENT_AMBULATORY_CARE_PROVIDER_SITE_OTHER): Admitting: Family

## 2023-12-30 ENCOUNTER — Ambulatory Visit (HOSPITAL_BASED_OUTPATIENT_CLINIC_OR_DEPARTMENT_OTHER)
Admission: RE | Admit: 2023-12-30 | Discharge: 2023-12-30 | Disposition: A | Discharge status: Home / Self Care | Source: Ambulatory Visit | Attending: Family | Admitting: Family

## 2023-12-30 VITALS — BP 110/64 | HR 82 | Temp 98.4°F | Ht 64.0 in | Wt 181.6 lb

## 2023-12-30 DIAGNOSIS — R591 Generalized enlarged lymph nodes: Secondary | ICD-10-CM | POA: Diagnosis not present

## 2023-12-30 DIAGNOSIS — Z3041 Encounter for surveillance of contraceptive pills: Secondary | ICD-10-CM

## 2023-12-30 DIAGNOSIS — M546 Pain in thoracic spine: Secondary | ICD-10-CM

## 2023-12-30 DIAGNOSIS — Z23 Encounter for immunization: Secondary | ICD-10-CM

## 2023-12-30 DIAGNOSIS — G8929 Other chronic pain: Secondary | ICD-10-CM

## 2023-12-30 MED ORDER — NORETHINDRONE 0.35 MG PO TABS: 1.0000 | ORAL_TABLET | Freq: Every day | ORAL | 3 refills | Status: DC

## 2023-12-30 NOTE — Progress Notes (Signed)
 Patient ID: Donna Galloway, female    DOB: 1995-04-28, 28 y.o.   MRN: [id number]  Chief Complaint  Patient presents with   Medical Management of Chronic Issues    sx for 1-14m    Back Pain    chronic  HPI: Reporting new swelling under her left armpit. States it is not paintful to touch but if she pushes on it deeper there is soreness, discomfort. Has had it for a few months now but has been smaller, over the last 2 weeks it has grown bigger. First time this has happened and it just came on randomly. Denies any trauma or razor cut, waxing, etc in the area. Denies any systemic sx of fever or fatigue. She is also reporting continued upper back pain that is believed mostly r/t her large breasts. She has had a plastic surgery consult for reduction but her insurance required therapy and weight loss before approving any surgery.   ASSESSMENT & PLAN:  Lymphadenopathy Large, approx. small grapefruit size swelling in lower left axillary, very soft with palpation, mildly tender with deep palpation.  -     US  SOFT TISSUE UPPER EXTREMITY LIMITED LEFT (NON-VASCULAR); Future  Immunization due -     Flu vaccine trivalent PF, 6mos and older(Flulaval,Afluria,Fluarix,Fluzone)  Chronic bilateral thoracic back pain mostly r/t breast hypertrophy. no previous evaluation. failed PT.  -     Ambulatory referral to Sports Medicine  Encounter for birth control pills maintenance  - norethindrone  (MICRONOR ) 0.35 MG tablet; Take 1 tablet (0.35 mg total) by mouth daily.  Dispense: 84 tablet; Refill: 3  Subjective:    Outpatient Medications Prior to Visit  Medication Sig Dispense Refill   ibuprofen  (ADVIL ) 600 MG tablet Take 1 tablet (600 mg total) by mouth every 6 (six) hours as needed. (Patient not taking: Reported on 12/30/2023) 30 tablet 3   misoprostol  (CYTOTEC ) 200 MCG tablet Place four tablets in between your gums and cheeks (two tablets on each side) as instructed. Alternatively, you may place the four  tablets in the vagina. (Patient not taking: Reported on 09/30/2023) 4 tablet 1   ondansetron  (ZOFRAN -ODT) 4 MG disintegrating tablet Take 1 tablet (4 mg total) by mouth every 8 (eight) hours as needed for nausea or vomiting. (Patient not taking: Reported on 09/30/2023) 20 tablet 0   oxyCODONE  (OXY IR/ROXICODONE ) 5 MG immediate release tablet Take 1 tablet (5 mg total) by mouth every 4 (four) hours as needed for severe pain (pain score 7-10) or breakthrough pain. (Patient not taking: Reported on 12/30/2023) 10 tablet 0   Prenatal Vit-Fe Fumarate-FA (MULTIVITAMIN-PRENATAL) 27-0.8 MG TABS tablet Take 1 tablet by mouth daily at 12 noon. (Patient not taking: Reported on 12/30/2023)     No facility-administered medications prior to visit.   Past Medical History:  Diagnosis Date   Anxiety    Depression    No past surgical history on file. Allergies  Allergen Reactions   Acetic Acid    Citrus    Penicillins Rash      Objective:    Physical Exam Vitals and nursing note reviewed.  Constitutional:      Appearance: Normal appearance.  Cardiovascular:     Rate and Rhythm: Normal rate and regular rhythm.  Pulmonary:     Effort: Pulmonary effort is normal.     Breath sounds: Normal breath sounds.  Musculoskeletal:        General: Normal range of motion.  Lymphadenopathy:     Upper Body:     Right  upper body: No axillary adenopathy.     Left upper body: Axillary adenopathy (large area of soft tissue swelling, approx size of small grapefruit, lower axilla, no erythema, very mild tenderness w/deep palpation) present.  Skin:    General: Skin is warm and dry.  Neurological:     Mental Status: She is alert.  Psychiatric:        Mood and Affect: Mood normal.        Behavior: Behavior normal.    BP 110/64   Pulse 82   Temp 98.4 F (36.9 C) (Oral)   Ht 5' 4 (1.626 m)   Wt 181 lb 9.6 oz (82.4 kg)   SpO2 98%   Breastfeeding No   BMI 31.17 kg/m  Wt Readings from Last 3 Encounters:  12/30/23  181 lb 9.6 oz (82.4 kg)  11/28/23 180 lb (81.6 kg)  09/30/23 188 lb (85.3 kg)      Lucius Krabbe, NP

## 2023-12-31 ENCOUNTER — Ambulatory Visit: Payer: Self-pay | Admitting: Family

## 2023-12-31 DIAGNOSIS — M7989 Other specified soft tissue disorders: Secondary | ICD-10-CM

## 2023-12-31 MED ORDER — NORETHINDRONE 0.35 MG PO TABS: 1.0000 | ORAL_TABLET | Freq: Every day | ORAL | 3 refills | Status: AC

## 2023-12-31 NOTE — Addendum Note (Signed)
 Addended by: Tamina Cyphers on: 12/31/2023 05:36 PM   Modules accepted: Orders

## 2023-12-31 NOTE — Addendum Note (Signed)
 Addended by: Lafern Brinkley on: 12/31/2023 06:28 AM   Modules accepted: Orders, Level of Service

## 2024-01-01 ENCOUNTER — Ambulatory Visit (HOSPITAL_COMMUNITY)
Admission: RE | Admit: 2024-01-01 | Discharge: 2024-01-01 | Disposition: A | Discharge status: Home / Self Care | Source: Ambulatory Visit | Attending: Family | Admitting: Family

## 2024-01-01 DIAGNOSIS — M7989 Other specified soft tissue disorders: Secondary | ICD-10-CM | POA: Insufficient documentation

## 2024-01-01 DIAGNOSIS — R222 Localized swelling, mass and lump, trunk: Secondary | ICD-10-CM | POA: Diagnosis not present

## 2024-01-01 MED ORDER — GADOBUTROL 1 MMOL/ML IV SOLN
8.0000 mL | Freq: Once | INTRAVENOUS | Status: AC | PRN
  Administered 2024-01-01: 8 mL via INTRAVENOUS

## 2024-01-03 ENCOUNTER — Encounter: Payer: Self-pay | Admitting: Family

## 2024-01-03 ENCOUNTER — Ambulatory Visit: Payer: Self-pay | Admitting: Family

## 2024-01-03 DIAGNOSIS — J3089 Other allergic rhinitis: Secondary | ICD-10-CM

## 2024-01-03 DIAGNOSIS — M7989 Other specified soft tissue disorders: Secondary | ICD-10-CM

## 2024-01-06 ENCOUNTER — Ambulatory Visit (INDEPENDENT_AMBULATORY_CARE_PROVIDER_SITE_OTHER)

## 2024-01-06 ENCOUNTER — Telehealth: Payer: Self-pay

## 2024-01-06 ENCOUNTER — Ambulatory Visit (INDEPENDENT_AMBULATORY_CARE_PROVIDER_SITE_OTHER): Admitting: Sports Medicine

## 2024-01-06 VITALS — BP 120/84 | HR 85 | Ht 64.0 in | Wt 179.0 lb

## 2024-01-06 DIAGNOSIS — G8929 Other chronic pain: Secondary | ICD-10-CM | POA: Diagnosis not present

## 2024-01-06 DIAGNOSIS — M545 Low back pain, unspecified: Secondary | ICD-10-CM

## 2024-01-06 DIAGNOSIS — M546 Pain in thoracic spine: Secondary | ICD-10-CM

## 2024-01-06 DIAGNOSIS — M7989 Other specified soft tissue disorders: Secondary | ICD-10-CM

## 2024-01-06 DIAGNOSIS — M549 Dorsalgia, unspecified: Secondary | ICD-10-CM | POA: Diagnosis not present

## 2024-01-06 MED ORDER — MELOXICAM 15 MG PO TABS: 15.0000 mg | ORAL_TABLET | Freq: Every day | ORAL | 0 refills | Status: AC

## 2024-01-06 MED ORDER — TRIAMCINOLONE ACETONIDE 55 MCG/ACT NA AERO: 1.0000 | INHALATION_SPRAY | Freq: Every day | NASAL | 2 refills | Status: AC

## 2024-01-06 NOTE — Progress Notes (Signed)
 Ben Jakeb Lamping D.[facility] [address] [place] [address] Phone: [phone]   Assessment and Plan:     1. Chronic bilateral thoracic back pain (Primary) 2. Chronic bilateral low back pain without sciatica -Chronic with exacerbation, initial visit - Broad, thoracic and lumbar back pain most consistent with musculoskeletal dysfunction.  Aggravating factors include physical activity, chest size - Patient has had physical therapy in the past with no significant improvement - Start HEP for lower and upper back - Start meloxicam 15 mg daily x2 weeks.  If still having pain after 2 weeks, complete 3rd-week of NSAID. May use remaining NSAID as needed once daily for pain control.  Do not to use additional over-the-counter NSAIDs (ibuprofen , naproxen , Advil , Aleve , etc.) while taking prescription NSAIDs.  May use Tylenol  [phone] mg 2 to 3 times a day for breakthrough pain. - X-rays obtained in clinic.  My interpretation: No acute fracture or vertebral collapse.  15 additional minutes spent for educating Therapeutic Home Exercise Program.  This included exercises focusing on stretching, strengthening, with focus on eccentric aspects.   Long term goals include an improvement in range of motion, strength, endurance as well as avoiding reinjury. Patient's frequency would include in 1-2 times a day, 3-5 times a week for a duration of 6-12 weeks. Proper technique shown and discussed handout in great detail with ATC.  All questions were discussed and answered.    Pertinent previous records reviewed include none   Follow Up: 6 weeks for reevaluation.  Could further discuss OMT versus prednisone versus advanced imaging versus sports bra/supportive clothing   Subjective:   I, Moenique Parris, am serving as a Neurosurgeon for Doctor Morene Mace  Chief Complaint: thoracic back pain   HPI:   01/06/24 Patient is a 28 year old female with thoracic back pain.  Patient states years of thoracic back pain. No MOI. Pain radiates up and down the beck . Decreased ROM. Tylenol  for the pain helps a little. Does endorse numbness in her hands and feet. She is not able to do her ADLs due to the pain. Notes that she does have a heavy chest thinks that were her pain is coming from. PT 2 years ago didn't help    Relevant Historical Information: None pertinent  Additional pertinent review of systems negative.   Current Outpatient Medications:    meloxicam (MOBIC) 15 MG tablet, Take 1 tablet (15 mg total) by mouth daily., Disp: 30 tablet, Rfl: 0   norethindrone  (MICRONOR ) 0.35 MG tablet, Take 1 tablet (0.35 mg total) by mouth daily., Disp: 84 tablet, Rfl: 3   Objective:     Vitals:   01/06/24 1109  BP: 120/84  Pulse: 85  SpO2: 97%  Weight: 179 lb (81.2 kg)  Height: 5' 4 (1.626 m)      Body mass index is 30.73 kg/m.    Physical Exam:    Gen: Appears well, nad, nontoxic and pleasant Psych: Alert and oriented, appropriate mood and affect Neuro: sensation intact, strength is 5/5 in upper and lower extremities, muscle tone wnl Skin: no susupicious lesions or rashes  Back - Normal skin, Spine with normal alignment and no deformity.     tenderness to T8-L2 vertebral process palpation.   T8-L2 paraspinous muscles are   tender and without spasm NTTP gluteal musculature Straight leg raise negative Trendelenberg negative Piriformis Test negative Gait normal  Mid back pain with flexion and extension  Electronically signed by:  Odis Mace D.O. CAQSM  [facility] 11:31 AM 01/06/24

## 2024-01-06 NOTE — Telephone Encounter (Signed)
 sent in Nasacort nasal spray. try to sleep with head of bed slightly elevated till better. thx

## 2024-01-06 NOTE — Telephone Encounter (Signed)
 Imaging results faxed to [phone]. I also called and left a voicemail for [employer].   Copied from CRM [id number]. Topic: General - Other >> Jan 06, 2024  8:55 AM Avram MATSU wrote: Reason for CRM: beth is calling from [facility] and wanted to know if the provider can send over the imagines of soft tissues mass/ US  breast. It is showing pending on their end.  Beth: [phone]

## 2024-01-06 NOTE — Patient Instructions (Signed)
-   Start meloxicam 15 mg daily x2 weeks.  If still having pain after 2 weeks, complete 3rd-week of NSAID. May use remaining NSAID as needed once daily for pain control.  Do not to use additional over-the-counter NSAIDs (ibuprofen , naproxen , Advil , Aleve , etc.) while taking prescription NSAIDs.  May use Tylenol  [phone] mg 2 to 3 times a day for breakthrough pain.  Low back HEP   If  you have not heard from your referral by next week contact your primary care physician  6 week follow up

## 2024-01-06 NOTE — Telephone Encounter (Signed)
 Donna Galloway called back and clarified that she received a fax from [facility] but what they received is something they can already see on their end. States that they saw patient had an order placed on 10/7 for a 3D mammogram. States this needs to be completed and they need to have results of this before they can schedule her. States they prefer to have this since the MRI that was completed seems to be contradicting itself.

## 2024-01-07 NOTE — Progress Notes (Unsigned)
 [address] [place],  [place], [place] [address]  Office: [phone]  /  Fax: [phone]   Subjective   Initial Visit  Donna Galloway (MR# [id number]) is a 28 y.o. female who presents for evaluation and treatment of obesity and related comorbidities. Current BMI is There is no height or weight on file to calculate BMI. Donna Galloway has been struggling with her weight for many years and has been unsuccessful in either losing weight, maintaining weight loss, or reaching her healthy weight goal.  Donna Galloway is currently in the action stage of change and ready to dedicate time achieving and maintaining a healthier weight. Donna Galloway is interested in becoming our patient and working on intensive lifestyle modifications including (but not limited to) diet and exercise for weight loss.  Weight history:  When asked how their weight has affected their life and health, she states: Contributed to orthopedic problems or mobility issues, Having fatigue, Having poor endurance, and Other: pendulous breasts  When asked what else they would like to accomplish? She states: Adopt a healthier eating pattern and lifestyle, Improve energy levels and physical activity, Improve existing medical conditions, Improve quality of life, and achieve BMI < 27 for bilateral breast reduction  She starting to note weight gain during : {emstartedtogainweight:31588}.  Life events associated with weight gain include : {emlifeeventsweighgain:31589}.   Other contributing factors: {EMcontributingfactors:28307}.  Their highest weight has been:  *** lbs.  Desired weight: ***  Previous weight-loss programs : None and Tracking [facility] Journaling.  Their maximum weight loss was:  *** lbs.  Their greatest challenge with dieting: {emgreatestchallengediet:31593}.  Current or previous pharmacotherapy: None.  Response to medication: Never tried medications   Nutritional History:  Current nutrition plan: High-protein and Portion control /  smart choices.  How many times do you eat outside the home: {emfrequency:31645}  How often do they skip meals: {emskipmeals:31594::does not skip meals}  What beverages do they drink: {embeverages:31595}.   Use of artificial sweetners : {Yes/No:30480221}  Food intolerances or dislikes: {emfoodintolerance:31596::none}.  Food triggers: {emfoodtriggers:31600::None}.  Food cravings: {emfoodcravings:31601}  Do they struggle with excessive hunger or portion control : {YES/NO:21197}   Physical Activity:  Current level of physical activity: Running / Jogging 30 minutes, three and Strength training 30 minutes, three  Barriers to Exercise: {embarrierstoexercise:32606::no barriers}   Past medical history includes:   Past Medical History:  Diagnosis Date   Anxiety    Depression      Objective   LMP 12/30/2023 (Approximate)  She was weighed on the bioimpedance scale: There is no height or weight on file to calculate BMI.    Anthropometrics:  Vitals BP: 120/84 Pulse Rate: 85 SpO2: 97 %   Anthropometric Measurements Height: 5' 4 (1.626 m) Weight: 179 lb (81.2 kg) BMI (Calculated): 30.71   No data recorded No data recorded  Physical Exam:  General: She is overweight, cooperative, alert, well developed, and in no acute distress. PSYCH: Has normal mood, affect and thought process.   HEENT: EOMI, sclerae are anicteric. Lungs: Normal breathing effort, no conversational dyspnea. Extremities: No edema.  Neurologic: No gross sensory or motor deficits. No tremors or fasciculations noted.    Diagnostic Data Reviewed  EKG: Normal sinus rhythm, rate ***. No conduction abnormalities, abnormal Q waves or chamber enlargement.  Indirect Calorimeter completed today shows a VO2 of *** and a REE of ***.  Her calculated basal metabolic rate is *** thus her {zfprmzdlou:68397}.  Depression Screen  Donna Galloway's PHQ-9 score was: ***.     11/30/2022  3:57 PM  Depression  screen PHQ 2/9  Decreased Interest 2  Down, Depressed, Hopeless 3  PHQ - 2 Score 5  Altered sleeping 2  Tired, decreased energy 3  Change in appetite 3  Feeling bad or failure about yourself  2  Trouble concentrating 2  Moving slowly or fidgety/restless 0  Suicidal thoughts 0  PHQ-9 Score 17  Difficult doing work/chores Somewhat difficult    Screening for Sleep Related Breathing Disorders  [name] {Actions; denies/reports/admits to:19208} daytime somnolence and {Actions; denies/reports/admits to:19208} waking up still tired. Patient has a history of symptoms of {OSA Sx:17850}. [name] generally gets {numbers (fuzzy):14653} hours of sleep per night, and states that she has {sleep quality:17851}. Snoring {is/are not:32546} present. Apneic episodes {is/are not:32546} present. Epworth Sleepiness Score is ***.   BMET    Component Value Date/Time   NA 139 02/09/2022 0952   K 4.1 02/09/2022 0952   CL 109 02/09/2022 0952   CO2 25 02/09/2022 0952   GLUCOSE 94 02/09/2022 0952   BUN 16 02/09/2022 0952   CREATININE 0.85 02/09/2022 0952   CALCIUM 8.7 02/09/2022 0952   GFRNONAA >90 01/23/2014 1715   GFRAA >90 01/23/2014 1715   Lab Results  Component Value Date   HGBA1C 4.9 02/15/2023   No results found for: INSULIN CBC    Component Value Date/Time   WBC 8.0 08/22/2023 1727   RBC 4.46 08/22/2023 1727   HGB 12.8 08/22/2023 1727   HCT 38.7 08/22/2023 1727   PLT 232 08/22/2023 1727   MCV 86.8 08/22/2023 1727   MCH 28.7 08/22/2023 1727   MCHC 33.1 08/22/2023 1727   RDW 11.8 08/22/2023 1727   Iron/TIBC/Ferritin/ %Sat No results found for: IRON, TIBC, FERRITIN, IRONPCTSAT Lipid Panel     Component Value Date/Time   CHOL 151 02/09/2022 0952   TRIG 38.0 02/09/2022 0952   HDL 47.50 02/09/2022 0952   CHOLHDL 3 02/09/2022 0952   VLDL 7.6 02/09/2022 0952   LDLCALC 96 02/09/2022 0952   Hepatic Function Panel     Component Value Date/Time   PROT 7.0 02/09/2022 0952    ALBUMIN 4.1 02/09/2022 0952   AST 13 02/09/2022 0952   ALT 10 02/09/2022 0952   ALKPHOS 54 02/09/2022 0952   BILITOT 0.4 02/09/2022 0952      Component Value Date/Time   TSH 0.843 10/01/2023 1603   TSH 0.59 02/09/2022 0952     Assessment and Plan   TREATMENT PLAN FOR OBESITY:  Recommended Dietary Goals  Aquanetta is currently in the action stage of change. As such, her goal is to implement medically supervised obesity management plan.  She has agreed to implement: {emwtlossplannewint:31639}  Behavioral Intervention  We discussed the following Behavioral Modification Strategies today: {EMwtlossstrategiesnewint:31640::increasing lean protein intake to established goals,decreasing simple carbohydrates ,increasing vegetables,increasing lower glycemic fruits,increasing fiber rich foods,avoiding skipping meals,increasing water intake,work on meal planning and preparation,reading food labels ,keeping healthy foods at home,identifying sources and decreasing liquid calories,decreasing eating out or consumption of processed foods, and making healthy choices when eating convenient foods,planning for success,better snacking choices}  Additional resources provided today: {emadditionalresourcesnewint:32116::Handout on healthy eating and balanced plate,Handout on complex carbohydrates and lean sources of protein,Handout principles of weight management}  Recommended Physical Activity Goals  Tyneshia has been advised to work up to 150 minutes of moderate intensity aerobic activity a week and strengthening exercises 2-3 times per week for cardiovascular health, weight loss maintenance and preservation of muscle mass.   She has agreed to :  {EMEXERCISE:28847::Think about  enjoyable ways to increase daily physical activity and overcoming barriers to exercise,Increase physical activity in their day and reduce sedentary time (increase NEAT).,Increase volume of physical  activity to a goal of 240 minutes a week,Combine aerobic and strengthening exercises for efficiency and improved cardiometabolic health.}  Medical Interventions and Pharmacotherapy We will work on building a Therapist, art and behavioral strategies. We will discuss the role of pharmacotherapy as an adjunct at subsequent visits.   ASSOCIATED CONDITIONS ADDRESSED TODAY  Other Fatigue ***. Madelene does feel that her weight is causing her energy to be lower than it should be. Fatigue may be related to obesity, depression or many other causes. Labs will be ordered, and in the meanwhile, Ugochi will focus on self care including making healthy food choices, increasing physical activity and focusing on stress reduction.  Shortness of Breath Earnestine notes increasing shortness of breath with physical activity and seems to be worsening over time with weight gain. She notes getting out of breath sooner with activity than she used to. This has not gotten worse recently. Shadie denies shortness of breath at rest or orthopnea.  There are no diagnoses linked to this encounter.  Follow-up  She was informed of the importance of frequent follow-up visits to maximize her success with intensive lifestyle modifications for her multiple health conditions. She was informed we would discuss her lab results at her next visit unless there is a critical issue that needs to be addressed sooner. Desare agreed to keep her next visit at the agreed upon time to discuss these results.  Attestation Statement  This is the patient's intake visit at [facility]. The patient's Health Questionnaire was reviewed at length. Included in the packet: current and past health history, medications, allergies, ROS, gynecologic history (women only), surgical history, family history, social history, weight history, weight loss surgery history (for those that have had weight loss surgery), nutritional  evaluation, mood and food questionnaire, PHQ9, Epworth questionnaire, sleep habits questionnaire, patient life and health improvement goals questionnaire. These will all be scanned into the patient's chart under media.   During the visit, I independently reviewed the patient's EKG***, previous labs, bioimpedance scale results, and indirect calorimetry results. I used this information to medically tailor a meal plan for the patient that will help her to lose weight and will improve her obesity-related conditions. I performed a medically necessary appropriate examination and/or evaluation. I discussed the assessment and treatment plan with the patient. The patient was provided an opportunity to ask questions and all were answered. The patient agreed with the plan and demonstrated an understanding of the instructions. Labs were ordered at this visit and will be reviewed at the next visit unless critical results need to be addressed immediately. Clinical information was updated and documented in the EMR.   In addition, they received basic education on identification of processed foods and reduction of these, different sources of lean proteins and complex carbohydrates and how to eat balanced by incorporation of whole foods.  Reviewed by clinician on day of visit: allergies, medications, problem list, medical history, surgical history, family history, social history, and previous encounter notes.  I have spent *** minutes in the care of the patient today including: {NUMBER 1-10:22536} minutes before the visit reviewing and preparing the chart. *** minutes face-to-face {emfacetoface:32598::assessing and reviewing listed medical problems as outlined in obesity care plan,providing nutritional and behavioral counseling on topics outlined in the obesity care plan,independently interpreting test results and goals of care, as described  in assessment and plan,reviewing and discussing biometric information and  progress} {NUMBER 1-10:22536} minutes after the visit updating chart and documentation of encounter.       Chrystine Frogge ANP-C

## 2024-01-07 NOTE — Addendum Note (Signed)
 Addended by: Jaylenn Altier on: 01/07/2024 03:37 PM   Modules accepted: Orders

## 2024-01-07 NOTE — Telephone Encounter (Signed)
 Both 3D mammo & breast U/S of left breast ordered and sent to [facility] since all other imaging done there.

## 2024-01-08 ENCOUNTER — Ambulatory Visit (INDEPENDENT_AMBULATORY_CARE_PROVIDER_SITE_OTHER): Admitting: Nurse Practitioner

## 2024-01-08 ENCOUNTER — Encounter (INDEPENDENT_AMBULATORY_CARE_PROVIDER_SITE_OTHER): Payer: Self-pay | Admitting: Nurse Practitioner

## 2024-01-08 VITALS — BP 102/74 | HR 74 | Temp 98.7°F | Ht 64.0 in | Wt 178.0 lb

## 2024-01-08 DIAGNOSIS — E559 Vitamin D deficiency, unspecified: Secondary | ICD-10-CM | POA: Diagnosis not present

## 2024-01-08 DIAGNOSIS — Z1331 Encounter for screening for depression: Secondary | ICD-10-CM | POA: Diagnosis not present

## 2024-01-08 DIAGNOSIS — M546 Pain in thoracic spine: Secondary | ICD-10-CM

## 2024-01-08 DIAGNOSIS — E66811 Obesity, class 1: Secondary | ICD-10-CM

## 2024-01-08 DIAGNOSIS — Z6830 Body mass index (BMI) 30.0-30.9, adult: Secondary | ICD-10-CM

## 2024-01-08 DIAGNOSIS — N6489 Other specified disorders of breast: Secondary | ICD-10-CM | POA: Diagnosis not present

## 2024-01-08 DIAGNOSIS — G8929 Other chronic pain: Secondary | ICD-10-CM | POA: Diagnosis not present

## 2024-01-08 DIAGNOSIS — R0602 Shortness of breath: Secondary | ICD-10-CM | POA: Diagnosis not present

## 2024-01-08 DIAGNOSIS — R5383 Other fatigue: Secondary | ICD-10-CM

## 2024-01-08 NOTE — Telephone Encounter (Signed)
 I reached out to pt and left a voicemail to contact [facility] at [facility] to get scheduled as soon as possible to get these complete, So she can get an appointment with [facility].

## 2024-01-09 ENCOUNTER — Ambulatory Visit (INDEPENDENT_AMBULATORY_CARE_PROVIDER_SITE_OTHER): Payer: Self-pay | Admitting: Nurse Practitioner

## 2024-01-09 ENCOUNTER — Ambulatory Visit: Payer: Self-pay | Admitting: Sports Medicine

## 2024-01-09 LAB — LIPID PANEL
Chol/HDL Ratio: 4.1 ratio (ref 0.0–4.4)
Cholesterol, Total: 158 mg/dL (ref 100–199)
HDL: 39 mg/dL — ABNORMAL LOW (ref >39)
LDL Chol Calc (NIH): 109 mg/dL — ABNORMAL HIGH (ref 0–99)
Triglycerides: 49 mg/dL (ref 0–149)
VLDL Cholesterol Cal: 10 mg/dL (ref 5–40)

## 2024-01-09 LAB — CBC WITH DIFFERENTIAL/PLATELET
Basophils Absolute: 0 x10E3/uL (ref 0.0–0.2)
Basos: 0 %
EOS (ABSOLUTE): 0 x10E3/uL (ref 0.0–0.4)
Eos: 1 %
Hematocrit: 40.6 % (ref 34.0–46.6)
Hemoglobin: 12.7 g/dL (ref 11.1–15.9)
Immature Grans (Abs): 0 x10E3/uL (ref 0.0–0.1)
Immature Granulocytes: 0 %
Lymphocytes Absolute: 2.3 x10E3/uL (ref 0.7–3.1)
Lymphs: 50 %
MCH: 29.1 pg (ref 26.6–33.0)
MCHC: 31.3 g/dL — ABNORMAL LOW (ref 31.5–35.7)
MCV: 93 fL (ref 79–97)
Monocytes Absolute: 0.3 x10E3/uL (ref 0.1–0.9)
Monocytes: 7 %
Neutrophils Absolute: 1.9 x10E3/uL (ref 1.4–7.0)
Neutrophils: 42 %
Platelets: 236 x10E3/uL (ref 150–450)
RBC: 4.36 x10E6/uL (ref 3.77–5.28)
RDW: 11.5 % — ABNORMAL LOW (ref 11.7–15.4)
WBC: 4.6 x10E3/uL (ref 3.4–10.8)

## 2024-01-09 LAB — COMPREHENSIVE METABOLIC PANEL WITH GFR
ALT: 19 IU/L (ref 0–32)
AST: 17 IU/L (ref 0–40)
Albumin: 4.2 g/dL (ref 4.0–5.0)
Alkaline Phosphatase: 67 IU/L (ref 41–116)
BUN/Creatinine Ratio: 26 — ABNORMAL HIGH (ref 9–23)
BUN: 18 mg/dL (ref 6–20)
Bilirubin Total: 0.7 mg/dL (ref 0.0–1.2)
CO2: 21 mmol/L (ref 20–29)
Calcium: 9.1 mg/dL (ref 8.7–10.2)
Chloride: 103 mmol/L (ref 96–106)
Creatinine, Ser: 0.7 mg/dL (ref 0.57–1.00)
Globulin, Total: 2.7 g/dL (ref 1.5–4.5)
Glucose: 83 mg/dL (ref 70–99)
Potassium: 4.4 mmol/L (ref 3.5–5.2)
Sodium: 135 mmol/L (ref 134–144)
Total Protein: 6.9 g/dL (ref 6.0–8.5)
eGFR: 120 mL/min/1.73 (ref >59)

## 2024-01-09 LAB — HEMOGLOBIN A1C
Est. average glucose Bld gHb Est-mCnc: 103 mg/dL
Hgb A1c MFr Bld: 5.2 % (ref 4.8–5.6)

## 2024-01-09 LAB — TSH: TSH: 0.694 u[IU]/mL (ref 0.450–4.500)

## 2024-01-09 LAB — INSULIN, RANDOM: INSULIN: 9.9 u[IU]/mL (ref 2.6–24.9)

## 2024-01-09 LAB — VITAMIN B12: Vitamin B-12: 743 pg/mL (ref 232–1245)

## 2024-01-09 LAB — MAGNESIUM: Magnesium: 2.1 mg/dL (ref 1.6–2.3)

## 2024-01-09 LAB — VITAMIN D 25 HYDROXY (VIT D DEFICIENCY, FRACTURES): Vit D, 25-Hydroxy: 29.9 ng/mL — ABNORMAL LOW (ref 30.0–100.0)

## 2024-01-16 ENCOUNTER — Other Ambulatory Visit: Payer: Self-pay

## 2024-01-16 DIAGNOSIS — M7989 Other specified soft tissue disorders: Secondary | ICD-10-CM

## 2024-01-22 ENCOUNTER — Ambulatory Visit (INDEPENDENT_AMBULATORY_CARE_PROVIDER_SITE_OTHER): Admitting: Nurse Practitioner

## 2024-01-22 ENCOUNTER — Encounter (INDEPENDENT_AMBULATORY_CARE_PROVIDER_SITE_OTHER): Payer: Self-pay | Admitting: Nurse Practitioner

## 2024-01-22 VITALS — BP 96/63 | HR 85 | Temp 98.2°F | Ht 64.0 in | Wt 175.0 lb

## 2024-01-22 DIAGNOSIS — E785 Hyperlipidemia, unspecified: Secondary | ICD-10-CM | POA: Diagnosis not present

## 2024-01-22 DIAGNOSIS — E66811 Obesity, class 1: Secondary | ICD-10-CM

## 2024-01-22 DIAGNOSIS — N6489 Other specified disorders of breast: Secondary | ICD-10-CM | POA: Diagnosis not present

## 2024-01-22 DIAGNOSIS — E559 Vitamin D deficiency, unspecified: Secondary | ICD-10-CM

## 2024-01-22 DIAGNOSIS — Z6830 Body mass index (BMI) 30.0-30.9, adult: Secondary | ICD-10-CM

## 2024-01-22 MED ORDER — VITAMIN D (ERGOCALCIFEROL) 1.25 MG (50000 UNIT) PO CAPS: 50000.0000 [IU] | ORAL_CAPSULE | ORAL | 1 refills | Status: DC

## 2024-01-22 NOTE — Progress Notes (Signed)
 Office: [phone]  /  Fax: [phone]  WEIGHT SUMMARY AND BIOMETRICS  Weight Lost Since Last Visit: 3 lb  Weight Gained Since Last Visit: 0   Vitals Temp: 98.2 F (36.8 C) BP: 96/63 Pulse Rate: 85 SpO2: 97 %   Anthropometric Measurements Height: 5' 4 (1.626 m) Weight: 175 lb (79.4 kg) BMI (Calculated): 30.02 Weight at Last Visit: 178 lb Weight Lost Since Last Visit: 3 lb Weight Gained Since Last Visit: 0 Starting Weight: 178 lb Total Weight Loss (lbs): 3 lb (1.361 kg) Peak Weight: 185 lb   Body Composition  Body Fat %: 30.9 % Fat Mass (lbs): 54.4 lbs Muscle Mass (lbs): 115.2 lbs Total Body Water (lbs): 75.6 lbs Visceral Fat Rating : 5   Other Clinical Data Fasting: no Labs: no Today's Visit #: 2 Starting Date: 01/08/24    Total Weight Loss:3 pounds  Bio Impedance Data reviewed with patient: Muscle is up 4 pounds, adipose is down 6.8 pounds, visceral fat rating remains at 5(very good). PBF decreased from 34.3% to 30.9%.   HPI  Chief Complaint: OBESITY  Donna Galloway is here to discuss her progress with her obesity treatment plan. She is on the the Category 1 Plan and states she is following her eating plan approximately 80 % of the time. She states she is exercising 60 minutes 4-5 days per week.   Interval History:  Since last office visit she continues to work on nutrition and exercise for weight loss.  She is trying to get 80 grams of protein and stick to 1000 calories a day. She has had more cravings on the weekend. She has been getting 100-125 grams of protein a day. She has been eating salmon. She continues to crave sweets- has been doing dark chocolate with almonds.  She has been skipping breakfast and will eat a brunch instead. She has been drinking about 48 ounces of water a day.    She is wanting a breast reduction as she has very pendulous breasts and causes upper back/shoulder and neck pain.   Lab results are reviewed with Donna Galloway in detail.  Liver enzymes, kidney functions, fasting glucose, electrolytes, blood count, A1c, TSH, fasting insulin and Vit b 12 are all normal. Lipid panel revealed Dyslipidemia, no current medication. Vit D is deficient at 29.9 and could benefit from supplementation.   Cancer screenings are reviewed with patient as obesity is a risk cancer for certain cancers.   Pap smear: 02/07/2022 Negative   PHYSICAL EXAM:  Blood pressure 96/63, pulse 85, temperature 98.2 F (36.8 C), height 5' 4 (1.626 m), weight 175 lb (79.4 kg), last menstrual period 12/30/2023, SpO2 97%. Body mass index is 30.04 kg/m.  General: Well Developed, well nourished, and in no acute distress.  HEENT: Normocephalic, atraumatic; EOMI, sclerae are anicteric. Skin: Warm and dry, good turgor Chest:  Normal excursion, shape, no gross ABN Respiratory: No conversational dyspnea; speaking in full sentences NeuroM-Sk:  Normal gross ROM * 4 extremities  Psych: A and O X 3, insight adequate, mood- full    DIAGNOSTIC DATA REVIEWED:  Last metabolic panel Lab Results  Component Value Date   GLUCOSE 83 01/08/2024   NA 135 01/08/2024   K 4.4 01/08/2024   CL 103 01/08/2024   CO2 21 01/08/2024   BUN 18 01/08/2024   CREATININE 0.70 01/08/2024   EGFR 120 01/08/2024   CALCIUM 9.1 01/08/2024   PROT 6.9 01/08/2024   ALBUMIN 4.2 01/08/2024   LABGLOB 2.7 01/08/2024   BILITOT 0.7 01/08/2024  ALKPHOS 67 01/08/2024   AST 17 01/08/2024   ALT 19 01/08/2024   ANIONGAP 11 01/23/2014    Lab Results  Component Value Date   HGBA1C 5.2 01/08/2024   HGBA1C 4.9 02/15/2023   Lab Results  Component Value Date   INSULIN 9.9 01/08/2024   Lab Results  Component Value Date   TSH 0.694 01/08/2024   CBC    Component Value Date/Time   WBC 4.6 01/08/2024 0832   WBC 8.0 08/22/2023 1727   RBC 4.36 01/08/2024 0832   RBC 4.46 08/22/2023 1727   HGB 12.7 01/08/2024 0832   HCT 40.6 01/08/2024 0832   PLT 236 01/08/2024 0832   MCV 93 01/08/2024  0832   MCH 29.1 01/08/2024 0832   MCH 28.7 08/22/2023 1727   MCHC 31.3 (L) 01/08/2024 0832   MCHC 33.1 08/22/2023 1727   RDW 11.5 (L) 01/08/2024 0832    Lipid Panel     Component Value Date/Time   CHOL 158 01/08/2024 0832   TRIG 49 01/08/2024 0832   HDL 39 (L) 01/08/2024 0832   CHOLHDL 4.1 01/08/2024 0832   CHOLHDL 3 02/09/2022 0952   VLDL 7.6 02/09/2022 0952   LDLCALC 109 (H) 01/08/2024 0832    Nutritional Lab Results  Component Value Date   VD25OH 29.9 (L) 01/08/2024   Lab Results  Component Value Date   VITAMINB12 743 01/08/2024     ASSESSMENT AND PLAN   Class 1 obesity without serious comorbidity with body mass index (BMI) of 30.0 to 30.9 in adult, unspecified obesity type TREATMENT PLAN FOR OBESITY:  Recommended Dietary Goals  Donna Galloway is currently in the action stage of change. As such, her goal is to continue weight management plan. She has agreed to the Category 1 Plan.  Behavioral Intervention  We discussed the following Behavioral Modification Strategies today: better snacking choices, celebration eating strategies, continue to work on maintaining a reduced calorie state, getting the recommended amount of protein, incorporating whole foods, making healthy choices, staying well hydrated and practicing mindfulness when eating., and increase protein intake, fibrous foods (25 grams per day for women, 30 grams for men) and water to improve satiety and decrease hunger signals. Work on picking protein and vegetables first when eating out and then can have some small amounts of starches. Can allow herself 1 hershey dark chocolate with almonds a day for her sweet craving- if unable to limit will hold for awhile.   Additional resources provided today: Given the eating out guide  Recommended Physical Activity Goals  Donna Galloway has been advised to work up to 240 minutes of moderate intensity aerobic activity a week and strengthening exercises 2-3 times per week for  cardiovascular health, weight loss maintenance and preservation of muscle mass.   She has agreed to Think about enjoyable ways to increase daily physical activity and overcoming barriers to exercise, Increase physical activity in their day and reduce sedentary time (increase NEAT)., Increase volume of physical activity to a goal of 240 minutes a week, and Combine aerobic and strengthening exercises for efficiency and improved cardiometabolic health.   Pharmacotherapy We discussed various medication options to help Donna Galloway with her weight loss efforts and we both agreed to continue nutrition and exercise.  ASSOCIATED CONDITIONS ADDRESSED TODAY  Action/Plan   Vitamin D deficiency Supplement with Ergocalciferol 50000 units once a week for 12 weeks and then recheck level.   -     Vitamin D, Ergocalciferol, (DRISDOL) 1.25 MG (50000 UNIT) CAPS capsule; Take 1 capsule (50,000  Units total) by mouth every 7 (seven) days.  Dyslipidemia Focus on implementing category 1 meal plan, limit saturated fats Loss of 10-15% body weight can improve lipid levels Focus on getting 240 minutes a week of moderate to high intensity exercise with strength training.    Bilateral pendulous breasts       Continue to work on nutrition, exercise and weight loss       Wear supportive bra with wide straps          Return in about 3 weeks (around 02/12/2024).Donna Galloway She was informed of the importance of frequent follow up visits to maximize her success with intensive lifestyle modifications for her multiple health conditions.   ATTESTASTION STATEMENTS:  Reviewed by clinician on day of visit: allergies, medications, problem list, medical history, surgical history, family history, social history, and previous encounter notes.   I personally spent a total of 37 minutes in the care of the patient today including preparing to see the patient, getting/reviewing separately obtained history, performing a medically appropriate  exam/evaluation, counseling and educating, placing orders, and documenting clinical information in the EHR.   Sheryll Dymek ANP-C

## 2024-01-23 ENCOUNTER — Ambulatory Visit
Admission: RE | Admit: 2024-01-23 | Discharge: 2024-01-23 | Disposition: A | Discharge status: Home / Self Care | Source: Ambulatory Visit | Attending: Family | Admitting: Family

## 2024-01-23 ENCOUNTER — Ambulatory Visit: Payer: Self-pay | Admitting: Family

## 2024-01-23 DIAGNOSIS — M7989 Other specified soft tissue disorders: Secondary | ICD-10-CM

## 2024-01-23 DIAGNOSIS — R2232 Localized swelling, mass and lump, left upper limb: Secondary | ICD-10-CM | POA: Diagnosis not present

## 2024-01-27 ENCOUNTER — Encounter: Payer: Self-pay | Admitting: Radiology

## 2024-02-04 ENCOUNTER — Other Ambulatory Visit: Payer: Self-pay | Admitting: Sports Medicine

## 2024-02-12 ENCOUNTER — Ambulatory Visit (INDEPENDENT_AMBULATORY_CARE_PROVIDER_SITE_OTHER): Payer: Self-pay | Admitting: Nurse Practitioner

## 2024-02-12 ENCOUNTER — Encounter (INDEPENDENT_AMBULATORY_CARE_PROVIDER_SITE_OTHER): Payer: Self-pay | Admitting: Nurse Practitioner

## 2024-02-12 VITALS — BP 109/66 | HR 81 | Temp 98.6°F | Ht 64.0 in | Wt 167.0 lb

## 2024-02-12 DIAGNOSIS — E559 Vitamin D deficiency, unspecified: Secondary | ICD-10-CM

## 2024-02-12 DIAGNOSIS — Z6828 Body mass index (BMI) 28.0-28.9, adult: Secondary | ICD-10-CM

## 2024-02-12 DIAGNOSIS — E785 Hyperlipidemia, unspecified: Secondary | ICD-10-CM

## 2024-02-12 DIAGNOSIS — E663 Overweight: Secondary | ICD-10-CM

## 2024-02-12 NOTE — Progress Notes (Signed)
 Office: [phone]  /  Fax: [phone]  WEIGHT SUMMARY AND BIOMETRICS  Weight Lost Since Last Visit: 8 lb  Weight Gained Since Last Visit: 0   Vitals Temp: 98.6 F (37 C) BP: 109/66 Pulse Rate: 81 SpO2: 98 %   Anthropometric Measurements Height: 5' 4 (1.626 m) Weight: 167 lb (75.8 kg) BMI (Calculated): 28.65 Weight at Last Visit: 175 lb Weight Lost Since Last Visit: 8 lb Weight Gained Since Last Visit: 0 Starting Weight: 178 lb Total Weight Loss (lbs): 11 lb (4.99 kg) Peak Weight: 185 lb   Body Composition  Body Fat %: 31.3 % Fat Mass (lbs): 52.2 lbs Muscle Mass (lbs): 109 lbs Total Body Water (lbs): 70.4 lbs Visceral Fat Rating : 4   Other Clinical Data Fasting: no Labs: no Today's Visit #: 3 Starting Date: 01/08/24    Total Weight Loss:11 pounds Percent of body weight lost: 6.2%  Bio Impedance Data reviewed with patient: Water weight is markedly different and do not believe bio impedence data is accurate- will reevaluate at next appointment.   HPI  Chief Complaint: OBESITY  Donna Galloway is here to discuss her progress with her obesity treatment plan. She is on the the Category 1 Plan and states she is following her eating plan approximately 90 % of the time. She states she is exercising 45-60 minutes 3 days per week.   Interval History:  Since last office visit she has been getting 80 grams of protein. She has been trying to do a protein shake every morning and then 3 days a week she will go exercise  and does  spin classes and pilates for 60-90 minutes.  Lunch has been normally a sandwich with turkey or salad with salmon, she will do fruit bowl. Dinner- Chicken and brown rice. Broccoli or zucchini.  She is getting at least 32 ounces of water. Set a goal of 2 bottles of water a day She has been taking ergocalciferol once a week but has noticed hot flashes and night sweats.   Thanksgiving is at her brothers house. She will be eating what they  prepare- she is not cooking.    PHYSICAL EXAM:  Blood pressure 109/66, pulse 81, temperature 98.6 F (37 C), height 5' 4 (1.626 m), weight 167 lb (75.8 kg), SpO2 98%. Body mass index is 28.67 kg/m.  General: Well Developed, well nourished, and in no acute distress.  HEENT: Normocephalic, atraumatic; EOMI, sclerae are anicteric. Skin: Warm and dry, good turgor Chest:  Normal excursion, shape, no gross ABN Respiratory: No conversational dyspnea; speaking in full sentences NeuroM-Sk:  Normal gross ROM * 4 extremities  Psych: A and O X 3, insight adequate, mood- full    DIAGNOSTIC DATA REVIEWED:  BMET    Component Value Date/Time   NA 135 01/08/2024 0832   K 4.4 01/08/2024 0832   CL 103 01/08/2024 0832   CO2 21 01/08/2024 0832   GLUCOSE 83 01/08/2024 0832   GLUCOSE 94 02/09/2022 0952   BUN 18 01/08/2024 0832   CREATININE 0.70 01/08/2024 0832   CALCIUM 9.1 01/08/2024 0832   GFRNONAA >90 01/23/2014 1715   GFRAA >90 01/23/2014 1715   Lab Results  Component Value Date   HGBA1C 5.2 01/08/2024   HGBA1C 4.9 02/15/2023   Lab Results  Component Value Date   INSULIN 9.9 01/08/2024   Lab Results  Component Value Date   TSH 0.694 01/08/2024   CBC    Component Value Date/Time   WBC 4.6 01/08/2024 9167  WBC 8.0 08/22/2023 1727   RBC 4.36 01/08/2024 0832   RBC 4.46 08/22/2023 1727   HGB 12.7 01/08/2024 0832   HCT 40.6 01/08/2024 0832   PLT 236 01/08/2024 0832   MCV 93 01/08/2024 0832   MCH 29.1 01/08/2024 0832   MCH 28.7 08/22/2023 1727   MCHC 31.3 (L) 01/08/2024 0832   MCHC 33.1 08/22/2023 1727   RDW 11.5 (L) 01/08/2024 0832   Iron Studies No results found for: IRON, TIBC, FERRITIN, IRONPCTSAT Lipid Panel     Component Value Date/Time   CHOL 158 01/08/2024 0832   TRIG 49 01/08/2024 0832   HDL 39 (L) 01/08/2024 0832   CHOLHDL 4.1 01/08/2024 0832   CHOLHDL 3 02/09/2022 0952   VLDL 7.6 02/09/2022 0952   LDLCALC 109 (H) 01/08/2024 0832   Hepatic  Function Panel     Component Value Date/Time   PROT 6.9 01/08/2024 0832   ALBUMIN 4.2 01/08/2024 0832   AST 17 01/08/2024 0832   ALT 19 01/08/2024 0832   ALKPHOS 67 01/08/2024 0832   BILITOT 0.7 01/08/2024 0832      Component Value Date/Time   TSH 0.694 01/08/2024 0832   Nutritional Lab Results  Component Value Date   VD25OH 29.9 (L) 01/08/2024     ASSESSMENT AND PLAN Overweight with body mass index (BMI) of 28 to 28.9 in adult  TREATMENT PLAN FOR OBESITY:  Recommended Dietary Goals  Donna Galloway is currently in the action stage of change. As such, her goal is to continue weight management plan. She has agreed to the Category 1 Plan.  Behavioral Intervention  We discussed the following Behavioral Modification Strategies today: avoiding skipping meals, increasing water intake , work on meal planning and preparation, celebration eating strategies- one plate with protein as largest portion, clean vegetable and then portion of anything she wants to try but food cannot touch on the plate, be mindful and enjoy your food  , continue to work on maintaining a reduced calorie state, getting the recommended amount of protein, incorporating whole foods, making healthy choices, staying well hydrated and practicing mindfulness when eating., and increase protein intake, fibrous foods (25 grams per day for women, 30 grams for men) and water to improve satiety and decrease hunger signals. .  Additional resources provided today: Reviewed skinny taste with patient- she is applying to [employer] for masters in nutrition and is very interested in doing new recipes  Recommended Physical Activity Goals  Donna Galloway has been advised to work up to 150 minutes of moderate intensity aerobic activity a week and strengthening exercises 2-3 times per week for cardiovascular health, weight loss maintenance and preservation of muscle mass.   She has agreed to Think about enjoyable ways to increase daily physical activity  and overcoming barriers to exercise, Increase physical activity in their day and reduce sedentary time (increase NEAT)., Increase volume of physical activity to a goal of 240 minutes a week, and Combine aerobic and strengthening exercises for efficiency and improved cardiometabolic health.   Pharmacotherapy We discussed various medication options to help Ladana with her weight loss efforts and we both agreed to continue ergocalciferol for now but if when she gets over her cold she continues to have hot flashes and night sweats hold for 2 weeks to see if symptoms resolve and are caused by ergocalciferol.  ASSOCIATED CONDITIONS ADDRESSED TODAY  Action/Plan  Vitamin D deficiency Continue ergocalciferol 50000 units once a week for now but if when she gets over her cold she continues  to have hot flashes and night sweats hold for 2 weeks to see if symptoms resolve and are caused by ergocalciferol.  Dyslipidemia Continue implementing category 1 meal plan, limit saturated fats Loss of 10-15% body weight can improve lipid levels and has already lost 6.2 % of body weight Focus on getting 240 minutes a week of moderate to high intensity exercise  with strength training.           Return in about 4 weeks (around 03/11/2024).SABRA She was informed of the importance of frequent follow up visits to maximize her success with intensive lifestyle modifications for her multiple health conditions.   ATTESTASTION STATEMENTS:  Reviewed by clinician on day of visit: allergies, medications, problem list, medical history, surgical history, family history, social history, and previous encounter notes.   I personally spent a total of 35 minutes in the care of the patient today including preparing to see the patient, getting/reviewing separately obtained history, performing a medically appropriate exam/evaluation, counseling and educating, and documenting clinical information in the EHR.   Farouk Vivero ANP-C

## 2024-03-11 ENCOUNTER — Ambulatory Visit (INDEPENDENT_AMBULATORY_CARE_PROVIDER_SITE_OTHER): Payer: Self-pay | Admitting: Nurse Practitioner

## 2024-03-11 ENCOUNTER — Encounter (INDEPENDENT_AMBULATORY_CARE_PROVIDER_SITE_OTHER): Payer: Self-pay | Admitting: Nurse Practitioner

## 2024-03-11 ENCOUNTER — Encounter: Payer: Self-pay | Admitting: Obstetrics and Gynecology

## 2024-03-11 VITALS — BP 114/74 | HR 83 | Temp 98.1°F | Ht 64.0 in | Wt 165.0 lb

## 2024-03-11 DIAGNOSIS — E559 Vitamin D deficiency, unspecified: Secondary | ICD-10-CM

## 2024-03-11 DIAGNOSIS — Z6830 Body mass index (BMI) 30.0-30.9, adult: Secondary | ICD-10-CM

## 2024-03-11 DIAGNOSIS — E785 Hyperlipidemia, unspecified: Secondary | ICD-10-CM

## 2024-03-11 DIAGNOSIS — K5900 Constipation, unspecified: Secondary | ICD-10-CM

## 2024-03-11 DIAGNOSIS — N6489 Other specified disorders of breast: Secondary | ICD-10-CM

## 2024-03-11 DIAGNOSIS — E66811 Obesity, class 1: Secondary | ICD-10-CM

## 2024-03-11 MED ORDER — VITAMIN D (ERGOCALCIFEROL) 1.25 MG (50000 UNIT) PO CAPS: 50000.0000 [IU] | ORAL_CAPSULE | ORAL | 1 refills | Status: DC

## 2024-03-11 NOTE — Progress Notes (Signed)
 Office: [phone]  /  Fax: [phone]  WEIGHT SUMMARY AND BIOMETRICS  Weight Lost Since Last Visit: 2 lb  Weight Gained Since Last Visit: 0   Vitals Temp: 98.1 F (36.7 C) BP: 114/74 Pulse Rate: 83 SpO2: 97 %   Anthropometric Measurements Height: 5' 4 (1.626 m) Weight: 165 lb (74.8 kg) BMI (Calculated): 28.31 Weight at Last Visit: 167 lb Weight Lost Since Last Visit: 2 lb Weight Gained Since Last Visit: 0 Starting Weight: 178 lb Total Weight Loss (lbs): 13 lb (5.897 kg) Peak Weight: 185 lb   Body Composition  Body Fat %: 31.8 % Fat Mass (lbs): 52.6 lbs Muscle Mass (lbs): 107 lbs Total Body Water (lbs): 70 lbs Visceral Fat Rating : 4   Other Clinical Data Fasting: no Labs: no Today's Visit #: 4 Starting Date: 01/08/24    Total Weight Loss: 13 pounds Percent of body weight lost: 7.3%   Bio Impedance Data reviewed with patient:Muscle is down 2 pounds and adipose is up 0.4 pounds.   HPI  Chief Complaint: OBESITY  Donna Galloway is here to discuss her progress with her obesity treatment plan. She is on the the Category 1 Plan and states she is following her eating plan approximately 90 % of the time. She states she is not currently exercising.    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 She did try 2 skinny taste recipes on plan.  She is drinking 80 ounces of water daily.  She has been having constipation and has been using water and Magnesium- this has helped.Moving her bowels daily.  Has been having periumbilical pain and is associated with nausea. She has tried Gas X with ho change in symptoms.   She continues to have neck/shoulder /thoracic back pain due to pendulous breasts.   Continues on Ergocalciferol  50000 units once a week for Vit D deficiency. Denies side effects.  She continues to work on  nutrition, exercise and weight loss to help lower LDL cholesterol levels and increase HDL levels.   PHYSICAL EXAM:  Blood pressure 114/74, pulse 83, temperature 98.1 F (36.7 C), height 5' 4 (1.626 m), weight 165 lb (74.8 kg), SpO2 97%. Body mass index is 28.32 kg/m.  General: Well Developed, well nourished, and in no acute distress.  HEENT: Normocephalic, atraumatic; EOMI, sclerae are anicteric. Skin: Warm and dry, good turgor Chest:  Normal excursion, shape, no gross ABN Respiratory: No conversational dyspnea; speaking in full sentences NeuroM-Sk:  Normal gross ROM * 4 extremities  Psych: A and O X 3, insight adequate, mood- full    DIAGNOSTIC DATA REVIEWED:  BMET    Component Value Date/Time   NA 135 01/08/2024 0832   K 4.4 01/08/2024 0832   CL 103 01/08/2024 0832   CO2 21 01/08/2024 0832   GLUCOSE 83 01/08/2024 0832   GLUCOSE 94 02/09/2022 0952   BUN 18 01/08/2024 0832   CREATININE 0.70 01/08/2024 0832   CALCIUM 9.1 01/08/2024 0832   GFRNONAA >90 01/23/2014 1715   GFRAA >90 01/23/2014 1715   Lab Results  Component Value Date   HGBA1C 5.2 01/08/2024   HGBA1C 4.9 02/15/2023   Lab Results  Component Value Date   INSULIN  9.9 01/08/2024   Lab Results  Component Value Date   TSH 0.694 01/08/2024   CBC    Component Value Date/Time  WBC 4.6 01/08/2024 0832   WBC 8.0 08/22/2023 1727   RBC 4.36 01/08/2024 0832   RBC 4.46 08/22/2023 1727   HGB 12.7 01/08/2024 0832   HCT 40.6 01/08/2024 0832   PLT 236 01/08/2024 0832   MCV 93 01/08/2024 0832   MCH 29.1 01/08/2024 0832   MCH 28.7 08/22/2023 1727   MCHC 31.3 (L) 01/08/2024 0832   MCHC 33.1 08/22/2023 1727   RDW 11.5 (L) 01/08/2024 0832   Iron Studies No results found for: IRON, TIBC, FERRITIN, IRONPCTSAT Lipid Panel     Component Value Date/Time   CHOL 158 01/08/2024 0832   TRIG 49 01/08/2024 0832   HDL 39 (L) 01/08/2024 0832   CHOLHDL 4.1 01/08/2024 0832   CHOLHDL 3 02/09/2022 0952   VLDL  7.6 02/09/2022 0952   LDLCALC 109 (H) 01/08/2024 0832   Hepatic Function Panel     Component Value Date/Time   PROT 6.9 01/08/2024 0832   ALBUMIN 4.2 01/08/2024 0832   AST 17 01/08/2024 0832   ALT 19 01/08/2024 0832   ALKPHOS 67 01/08/2024 0832   BILITOT 0.7 01/08/2024 0832      Component Value Date/Time   TSH 0.694 01/08/2024 0832   Nutritional Lab Results  Component Value Date   VD25OH 29.9 (L) 01/08/2024     ASSESSMENT AND PLAN  Class 1 obesity without serious comorbidity with body mass index (BMI) of 30.0 to 30.9 in adult, unspecified obesity type  TREATMENT PLAN FOR OBESITY:  Recommended Dietary Goals  Donna Galloway is currently in the action stage of change. As such, her goal is to continue weight management plan. She has agreed to the Category 1 Plan.  Behavioral Intervention  We discussed the following Behavioral Modification Strategies today: increasing lean protein intake to established goals, increasing vegetables, avoiding skipping meals, increasing water intake , celebration eating strategies, continue to work on maintaining a reduced calorie state, getting the recommended amount of protein, incorporating whole foods, making healthy choices, staying well hydrated and practicing mindfulness when eating., and increase protein intake, fibrous foods (25 grams per day for women, 30 grams for men) and water to improve satiety and decrease hunger signals. .  She wants to lose weight in December - She does  recognize that she must follow a structured plan and will eat before social situation to meet her protein goals and minimze party foods - She will give away food gifts unless they are very low calories or high protein - She will avoid calorie-containing liquids such as holiday drinks, eggnog and alcohol -She will stick to her structured plan strictly most of the time - This strategy should help her lose 1-2 pounds in December   Recommended Physical Activity  Goals  [name] has been advised to work up to 150 minutes of moderate intensity aerobic activity a week and strengthening exercises 2-3 times per week for cardiovascular health, weight loss maintenance and preservation of muscle mass.   She has agreed to Start aerobic activity with a goal of 150 minutes a week at moderate intensity.    Pharmacotherapy We discussed various medication options to help Cassidee with her weight loss efforts and we both agreed to continue ergocalciferol  50000 units qw for Vit D deficiency- denies side effects to medication. .  ASSOCIATED CONDITIONS ADDRESSED TODAY  Action/Plan  Dyslipidemia Focus on implementing category 1 meal plan, limit saturated fats. Increase lean protein, water and fiber Loss of 10-15% body weight can improve lipid levels Focus on getting 150 minutes a week  of moderate to high intensity exercise  Continue to follow regularly with PCP  Bilateral pendulous breasts       Continue to focus on Category 1 meal plan, exercise and weight loss.       Will be reevaluated by plastic surgery after weight loss goal achieved.   Vitamin D  deficiency Continue to supplement with Ergocalciferol  50000 units once a week  Low vitamin D  levels can be associated with adiposity and may result in leptin resistance and weight gain. Also associated with fatigue.  Currently on vitamin D  supplementation without any adverse effects such as nausea, vomiting or muscle weakness.    -     Vitamin D  (Ergocalciferol ); Take 1 capsule (50,000 Units total) by mouth every 7 (seven) days.  Dispense: 5 capsule; Refill: 1  Constipation, unspecified constipation type       Try Mag Citrate, increase water and fiber. If no change in symptoms or if abdominal pain worsens she is to call PCP to schedule an appointment        Return in about 4 weeks (around 04/08/2024).SABRA She was informed of the importance of frequent follow up visits to maximize her success with intensive  lifestyle modifications for her multiple health conditions.   ATTESTASTION STATEMENTS:  Reviewed by clinician on day of visit: allergies, medications, problem list, medical history, surgical history, family history, social history, and previous encounter notes.     Anhad Sheeley ANP-C

## 2024-03-13 ENCOUNTER — Encounter: Payer: Self-pay | Admitting: Family

## 2024-03-25 ENCOUNTER — Other Ambulatory Visit: Payer: Self-pay | Admitting: Surgery

## 2024-03-25 ENCOUNTER — Encounter: Payer: Self-pay | Admitting: Family

## 2024-03-27 NOTE — Telephone Encounter (Signed)
 will need OV if using OTC hydrocortisone and not working, thx

## 2024-03-30 NOTE — Telephone Encounter (Signed)
 Patient notified of note from [facility] via [place].

## 2024-04-02 ENCOUNTER — Ambulatory Visit (INDEPENDENT_AMBULATORY_CARE_PROVIDER_SITE_OTHER): Admitting: Family

## 2024-04-02 ENCOUNTER — Encounter: Payer: Self-pay | Admitting: Family

## 2024-04-02 VITALS — BP 116/80 | HR 71 | Temp 97.2°F | Ht 64.0 in | Wt 174.2 lb

## 2024-04-02 DIAGNOSIS — R21 Rash and other nonspecific skin eruption: Secondary | ICD-10-CM

## 2024-04-02 MED ORDER — TRIAMCINOLONE ACETONIDE 0.5 % EX OINT: 1.0000 | TOPICAL_OINTMENT | Freq: Two times a day (BID) | CUTANEOUS | 1 refills | Status: AC

## 2024-04-02 NOTE — Progress Notes (Signed)
 "  Patient ID: Donna Galloway, female    DOB: 1995/04/06, 29 y.o.   MRN: [id number]  Chief Complaint  Patient presents with   Rash    Pt c/o rash on right breast  Discussed the use of AI scribe software for clinical note transcription with the patient, who gave verbal consent to proceed.  History of Present Illness Donna Galloway is a 29 year old female who presents with a persistent itchy rash.  About two weeks ago she developed a sudden itchy rash that has remained stable in size. It initially looked like a bruise, then became very pruritic and cosmetically concerning. Hydrocortisone cream did not relieve symptoms. She now has generalized pruritus with additional itchy spots. She denies recent changes in diet, new medications, increased stress, or prior similar episodes and is unsure of the cause. She has lymphoma and is scheduled for related surgery in February.  Assessment & Plan Skin rash Erythematous rash on right breast, 3 cm, persistent itching, unresponsive to hydrocortisone. More smaller, similar lesions on trunk. Possible hives or other dermatological condition. - Prescribed triamcinolone  ointment 0.5%, apply twice daily for one week. - Advised mixing triamcinolone  with body cream to prevent skin lightening. - Instructed reassessment after one week, discontinue if no improvement and call the office.   Subjective:    Outpatient Medications Prior to Visit  Medication Sig Dispense Refill   meloxicam  (MOBIC ) 15 MG tablet Take 1 tablet (15 mg total) by mouth daily. 30 tablet 0   norethindrone  (MICRONOR ) 0.35 MG tablet Take 1 tablet (0.35 mg total) by mouth daily. 84 tablet 3   triamcinolone  (NASACORT ) 55 MCG/ACT AERO nasal inhaler Place 1 spray into the nose daily. Start with 1 spray each side twice a day for 3 days, then reduce to daily until symptoms resolve. 1 each 2   Vitamin D , Ergocalciferol , (DRISDOL ) 1.25 MG (50000 UNIT) CAPS capsule Take 1 capsule (50,000 Units total) by  mouth every 7 (seven) days. 5 capsule 1   No facility-administered medications prior to visit.   Past Medical History:  Diagnosis Date   Abdominal gas pain 06/25/2022   Really bad gas pain since last year     Anxiety    Back pain    Constipation    Depression    Functional abdominal bloating after eating certain foods 06/25/2022   Anything I eat I tend to bloat really bad     Migraines    Palpitation    Unexplained weight gain 06/25/2022   Unable to lose weight despite dieting and exercising     Vitamin D  deficiency    History reviewed. No pertinent surgical history. Allergies[1]    Objective:    Physical Exam Vitals and nursing note reviewed.  Constitutional:      Appearance: Normal appearance.  Cardiovascular:     Rate and Rhythm: Normal rate and regular rhythm.  Pulmonary:     Effort: Pulmonary effort is normal.     Breath sounds: Normal breath sounds.  Musculoskeletal:        General: Normal range of motion.  Skin:    General: Skin is warm and dry.     Findings: Rash (right breast, raised, scaly, mild erythema, approx 3cm in diameter (pic in chart); also smaller diffuse lesions noted on trunk) present.  Neurological:     Mental Status: She is alert.  Psychiatric:        Mood and Affect: Mood normal.        Behavior: Behavior normal.  BP 116/80 (BP Location: Left Arm, Patient Position: Sitting, Cuff Size: Large)   Pulse 71   Temp (!) 97.2 F (36.2 C) (Temporal)   Ht 5' 4 (1.626 m)   Wt 174 lb 4 oz (79 kg)   LMP 03/18/2024 (Approximate)   SpO2 100%   BMI 29.91 kg/m  Wt Readings from Last 3 Encounters:  04/02/24 174 lb 4 oz (79 kg)  03/11/24 165 lb (74.8 kg)  02/12/24 167 lb (75.8 kg)      Shenicka Sunderlin, NP     [1]  Allergies Allergen Reactions   Acetic Acid    Citrus    Penicillins Rash   "

## 2024-04-15 ENCOUNTER — Ambulatory Visit (INDEPENDENT_AMBULATORY_CARE_PROVIDER_SITE_OTHER): Admitting: Nurse Practitioner

## 2024-04-15 ENCOUNTER — Encounter: Payer: Self-pay | Admitting: Family

## 2024-04-15 ENCOUNTER — Encounter (INDEPENDENT_AMBULATORY_CARE_PROVIDER_SITE_OTHER): Payer: Self-pay | Admitting: Nurse Practitioner

## 2024-04-15 VITALS — BP 122/71 | HR 72 | Temp 98.6°F | Ht 64.0 in | Wt 170.0 lb

## 2024-04-15 DIAGNOSIS — Z6830 Body mass index (BMI) 30.0-30.9, adult: Secondary | ICD-10-CM | POA: Diagnosis not present

## 2024-04-15 DIAGNOSIS — E559 Vitamin D deficiency, unspecified: Secondary | ICD-10-CM | POA: Diagnosis not present

## 2024-04-15 DIAGNOSIS — E66811 Obesity, class 1: Secondary | ICD-10-CM | POA: Diagnosis not present

## 2024-04-15 DIAGNOSIS — E785 Hyperlipidemia, unspecified: Secondary | ICD-10-CM | POA: Diagnosis not present

## 2024-04-15 DIAGNOSIS — N6489 Other specified disorders of breast: Secondary | ICD-10-CM

## 2024-04-15 MED ORDER — VITAMIN D (ERGOCALCIFEROL) 1.25 MG (50000 UNIT) PO CAPS: 50000.0000 [IU] | ORAL_CAPSULE | ORAL | 1 refills | Status: AC

## 2024-04-15 NOTE — Progress Notes (Signed)
 " Office: [phone]  /  Fax: [phone]  WEIGHT SUMMARY AND BIOMETRICS  Weight Lost Since Last Visit: 0  Weight Gained Since Last Visit: 5 lb   Vitals Temp: 98.6 F (37 C) BP: 122/71 Pulse Rate: 72 SpO2: 100 %   Anthropometric Measurements Height: 5' 4 (1.626 m) Weight: 170 lb (77.1 kg) BMI (Calculated): 29.17 Weight at Last Visit: 165 lb Weight Lost Since Last Visit: 0 Weight Gained Since Last Visit: 5 lb Starting Weight: 178 lb Total Weight Loss (lbs): 8 lb (3.629 kg) Peak Weight: 185 lb   Body Composition  Body Fat %: 33 % Fat Mass (lbs): 56.2 lbs Muscle Mass (lbs): 108.4 lbs Total Body Water (lbs): 73.4 lbs Visceral Fat Rating : 5   Other Clinical Data Fasting: no Labs: no Today's Visit #: 5 Starting Date: 01/08/24    Total Weight Loss: 8 pounds  Bio Impedance Data reviewed with patient: Muscle is up 1.4 pounds, adipose is up 3.4 pounds  HPI  Chief Complaint: OBESITY  Donna Galloway is here to discuss her progress with her obesity treatment plan. She is on the the Category 1 Plan and states she is following her eating plan approximately 50 % of the time. She states she is exercising 30-45 minutes 2 days per week.   Interval History:  Since last office visit she has started her new job but has been eating out more at her new job. She has not been doing meal prep.  She is getting 100 grams of protein. She is trying to limit her calories to 1000-1100. She has not been exercising as much- went to gym yesterday. She has been more fatigued with new job.  She has been drinking about 80 ounces of water.  She did have some splurging over the holiday.  She has surgery upcoming for lipoma under left arm.    She does have dyslipidemia and continues to work on nutrition, exercise and weight loss to help lower levels.  Lab Results  Component Value Date   CHOL 158 01/08/2024   HDL 39 (L) 01/08/2024   LDLCALC 109 (H) 01/08/2024   TRIG 49 01/08/2024   CHOLHDL  4.1 01/08/2024    She continues on Ergocalciferol  50000 units once a week for Vit D deficiency and denies side effects.  Last vitamin D  Lab Results  Component Value Date   VD25OH 29.9 (L) 01/08/2024     PHYSICAL EXAM:  Blood pressure 122/71, pulse 72, temperature 98.6 F (37 C), height 5' 4 (1.626 m), weight 170 lb (77.1 kg), last menstrual period 03/18/2024, SpO2 100%. Body mass index is 29.18 kg/m.  General: Well Developed, well nourished, and in no acute distress.  HEENT: Normocephalic, atraumatic; EOMI, sclerae are anicteric. Skin: Warm and dry, good turgor Chest:  Normal excursion, shape, no gross ABN Respiratory: No conversational dyspnea; speaking in full sentences NeuroM-Sk:  Normal gross ROM * 4 extremities  Psych: A and O X 3, insight adequate, mood- full    DIAGNOSTIC DATA REVIEWED:  BMET    Component Value Date/Time   NA 135 01/08/2024 0832   K 4.4 01/08/2024 0832   CL 103 01/08/2024 0832   CO2 21 01/08/2024 0832   GLUCOSE 83 01/08/2024 0832   GLUCOSE 94 02/09/2022 0952   BUN 18 01/08/2024 0832   CREATININE 0.70 01/08/2024 0832   CALCIUM 9.1 01/08/2024 0832   GFRNONAA >90 01/23/2014 1715   GFRAA >90 01/23/2014 1715   Lab Results  Component Value Date   HGBA1C 5.2  01/08/2024   HGBA1C 4.9 02/15/2023   Lab Results  Component Value Date   INSULIN  9.9 01/08/2024   Lab Results  Component Value Date   TSH 0.694 01/08/2024   CBC    Component Value Date/Time   WBC 4.6 01/08/2024 0832   WBC 8.0 08/22/2023 1727   RBC 4.36 01/08/2024 0832   RBC 4.46 08/22/2023 1727   HGB 12.7 01/08/2024 0832   HCT 40.6 01/08/2024 0832   PLT 236 01/08/2024 0832   MCV 93 01/08/2024 0832   MCH 29.1 01/08/2024 0832   MCH 28.7 08/22/2023 1727   MCHC 31.3 (L) 01/08/2024 0832   MCHC 33.1 08/22/2023 1727   RDW 11.5 (L) 01/08/2024 0832   Iron Studies No results found for: IRON, TIBC, FERRITIN, IRONPCTSAT Lipid Panel     Component Value Date/Time   CHOL 158  01/08/2024 0832   TRIG 49 01/08/2024 0832   HDL 39 (L) 01/08/2024 0832   CHOLHDL 4.1 01/08/2024 0832   CHOLHDL 3 02/09/2022 0952   VLDL 7.6 02/09/2022 0952   LDLCALC 109 (H) 01/08/2024 0832   Hepatic Function Panel     Component Value Date/Time   PROT 6.9 01/08/2024 0832   ALBUMIN 4.2 01/08/2024 0832   AST 17 01/08/2024 0832   ALT 19 01/08/2024 0832   ALKPHOS 67 01/08/2024 0832   BILITOT 0.7 01/08/2024 0832      Component Value Date/Time   TSH 0.694 01/08/2024 0832   Nutritional Lab Results  Component Value Date   VD25OH 29.9 (L) 01/08/2024     ASSESSMENT AND PLAN Class 1 obesity without serious comorbidity with body mass index (BMI) of 30.0 to 30.9 in adult, unspecified obesity type TREATMENT PLAN FOR OBESITY:  Recommended Dietary Goals  Angles is currently in the action stage of change. As such, her goal is to continue weight management plan. She has agreed to the Category 1 Plan.  Behavioral Intervention  We discussed the following Behavioral Modification Strategies today: increasing lean protein intake to established goals, decreasing simple carbohydrates , increasing fiber rich foods, avoiding skipping meals, increasing water intake , work on meal planning and preparation, and continue to work on implementation of reduced calorie nutritional plan.    Recommended Physical Activity Goals  Lauri has been advised to work up to 150 minutes of moderate intensity aerobic activity a week and strengthening exercises 2-3 times per week for cardiovascular health, weight loss maintenance and preservation of muscle mass.   She has agreed to Continue aerobic activity with a goal of 150 minutes a week at moderate intensity.    Pharmacotherapy We discussed various medication options to help Amayrani with her weight loss efforts and we both agreed to continue Ergocalciferol  50000 units once a week for Vit D deficiency.  ASSOCIATED CONDITIONS ADDRESSED  TODAY  Action/Plan  Bilateral pendulous breasts       Continue to work on weight loss- nutrition and exercise to get to weight needed for breast reduction       Wear firm supportive bra as much as possible.  Vitamin D  deficiency Supplement with Ergocalciferol  50000 units once a week  Low vitamin D  levels can be associated with adiposity and may result in leptin resistance and weight gain. Also associated with fatigue.  Currently on vitamin D  supplementation without any adverse effects such as nausea, vomiting or muscle weakness.   -     Vitamin D  (Ergocalciferol ); Take 1 capsule (50,000 Units total) by mouth every 7 (seven) days.  Dispense: 5  capsule; Refill: 1  Dyslipidemia Focus on implementing category 1 meal plan, limit saturated fats Loss of 10-15% body weight can improve lipid levels Focus on getting 150 minutes a week of moderate to high intensity exercise           Return in about 4 weeks (around 05/13/2024).Donna Galloway She was informed of the importance of frequent follow up visits to maximize her success with intensive lifestyle modifications for her multiple health conditions.   ATTESTASTION STATEMENTS:  Reviewed by clinician on day of visit: allergies, medications, problem list, medical history, surgical history, family history, social history, and previous encounter notes.     Lonell Liverpool ANP-C "

## 2024-05-12 ENCOUNTER — Ambulatory Visit (INDEPENDENT_AMBULATORY_CARE_PROVIDER_SITE_OTHER): Admitting: Nurse Practitioner

## 2024-05-26 ENCOUNTER — Encounter (HOSPITAL_BASED_OUTPATIENT_CLINIC_OR_DEPARTMENT_OTHER): Payer: Self-pay

## 2024-05-26 ENCOUNTER — Ambulatory Visit (HOSPITAL_COMMUNITY): Admit: 2024-05-26 | Admitting: Surgery
